# Patient Record
Sex: Female | Born: 1945 | Race: White | Hispanic: No | Marital: Married | Smoking: Never smoker
Health system: Southern US, Community
[De-identification: ages and names within clinical notes are randomized; demographics above are authoritative.]

## PROBLEM LIST (undated history)

## (undated) DIAGNOSIS — F329 Major depressive disorder, single episode, unspecified: Secondary | ICD-10-CM

## (undated) DIAGNOSIS — K219 Gastro-esophageal reflux disease without esophagitis: Secondary | ICD-10-CM

## (undated) DIAGNOSIS — I4891 Unspecified atrial fibrillation: Secondary | ICD-10-CM

## (undated) DIAGNOSIS — I2510 Atherosclerotic heart disease of native coronary artery without angina pectoris: Secondary | ICD-10-CM

## (undated) DIAGNOSIS — F32A Depression, unspecified: Secondary | ICD-10-CM

## (undated) DIAGNOSIS — C801 Malignant (primary) neoplasm, unspecified: Secondary | ICD-10-CM

## (undated) DIAGNOSIS — R32 Unspecified urinary incontinence: Secondary | ICD-10-CM

## (undated) DIAGNOSIS — I5020 Unspecified systolic (congestive) heart failure: Secondary | ICD-10-CM

## (undated) HISTORY — PX: OTHER SURGICAL HISTORY: SHX169

---
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
## 2019-08-06 ENCOUNTER — Inpatient Hospital Stay (HOSPITAL_COMMUNITY)
Admission: AD | Admit: 2019-08-06 | Discharge: 2019-08-10 | DRG: 308 | Disposition: A | Discharge status: Home / Self Care | Payer: Medicare PPO | Source: Other Acute Inpatient Hospital | Attending: Family Medicine | Admitting: Family Medicine

## 2019-08-06 DIAGNOSIS — Z923 Personal history of irradiation: Secondary | ICD-10-CM | POA: Diagnosis not present

## 2019-08-06 DIAGNOSIS — I5020 Unspecified systolic (congestive) heart failure: Secondary | ICD-10-CM | POA: Diagnosis present

## 2019-08-06 DIAGNOSIS — Z7901 Long term (current) use of anticoagulants: Secondary | ICD-10-CM | POA: Diagnosis not present

## 2019-08-06 DIAGNOSIS — Z8589 Personal history of malignant neoplasm of other organs and systems: Secondary | ICD-10-CM | POA: Diagnosis not present

## 2019-08-06 DIAGNOSIS — J159 Unspecified bacterial pneumonia: Secondary | ICD-10-CM | POA: Diagnosis present

## 2019-08-06 DIAGNOSIS — C3432 Malignant neoplasm of lower lobe, left bronchus or lung: Secondary | ICD-10-CM | POA: Diagnosis present

## 2019-08-06 DIAGNOSIS — K219 Gastro-esophageal reflux disease without esophagitis: Secondary | ICD-10-CM | POA: Diagnosis present

## 2019-08-06 DIAGNOSIS — I110 Hypertensive heart disease with heart failure: Secondary | ICD-10-CM | POA: Diagnosis present

## 2019-08-06 DIAGNOSIS — I2510 Atherosclerotic heart disease of native coronary artery without angina pectoris: Secondary | ICD-10-CM | POA: Diagnosis present

## 2019-08-06 DIAGNOSIS — J189 Pneumonia, unspecified organism: Secondary | ICD-10-CM

## 2019-08-06 DIAGNOSIS — K5790 Diverticulosis of intestine, part unspecified, without perforation or abscess without bleeding: Secondary | ICD-10-CM | POA: Diagnosis present

## 2019-08-06 DIAGNOSIS — Z23 Encounter for immunization: Secondary | ICD-10-CM | POA: Diagnosis not present

## 2019-08-06 DIAGNOSIS — Z955 Presence of coronary angioplasty implant and graft: Secondary | ICD-10-CM | POA: Diagnosis not present

## 2019-08-06 DIAGNOSIS — I5023 Acute on chronic systolic (congestive) heart failure: Secondary | ICD-10-CM

## 2019-08-06 DIAGNOSIS — D696 Thrombocytopenia, unspecified: Secondary | ICD-10-CM | POA: Diagnosis present

## 2019-08-06 DIAGNOSIS — Z20828 Contact with and (suspected) exposure to other viral communicable diseases: Secondary | ICD-10-CM | POA: Diagnosis present

## 2019-08-06 DIAGNOSIS — I160 Hypertensive urgency: Secondary | ICD-10-CM | POA: Diagnosis present

## 2019-08-06 DIAGNOSIS — Z66 Do not resuscitate: Secondary | ICD-10-CM | POA: Diagnosis present

## 2019-08-06 DIAGNOSIS — Z800 Family history of malignant neoplasm of digestive organs: Secondary | ICD-10-CM | POA: Diagnosis not present

## 2019-08-06 DIAGNOSIS — R918 Other nonspecific abnormal finding of lung field: Secondary | ICD-10-CM | POA: Diagnosis not present

## 2019-08-06 DIAGNOSIS — R079 Chest pain, unspecified: Secondary | ICD-10-CM

## 2019-08-06 DIAGNOSIS — I4891 Unspecified atrial fibrillation: Principal | ICD-10-CM | POA: Diagnosis present

## 2019-08-06 DIAGNOSIS — Z833 Family history of diabetes mellitus: Secondary | ICD-10-CM | POA: Diagnosis not present

## 2019-08-06 DIAGNOSIS — R296 Repeated falls: Secondary | ICD-10-CM | POA: Diagnosis present

## 2019-08-06 DIAGNOSIS — I340 Nonrheumatic mitral (valve) insufficiency: Secondary | ICD-10-CM | POA: Diagnosis not present

## 2019-08-06 HISTORY — DX: Malignant (primary) neoplasm, unspecified: C80.1

## 2019-08-06 HISTORY — DX: Gastro-esophageal reflux disease without esophagitis: K21.9

## 2019-08-06 HISTORY — DX: Unspecified systolic (congestive) heart failure: I50.20

## 2019-08-06 HISTORY — DX: Unspecified urinary incontinence: R32

## 2019-08-06 HISTORY — DX: Atherosclerotic heart disease of native coronary artery without angina pectoris: I25.10

## 2019-08-06 HISTORY — DX: Unspecified atrial fibrillation: I48.91

## 2019-08-06 HISTORY — DX: Depression, unspecified: F32.A

## 2019-08-06 LAB — CBC
HCT: 34.9 % — ABNORMAL LOW (ref 36.0–46.0)
Hemoglobin: 11.2 g/dL — ABNORMAL LOW (ref 12.0–15.0)
MCH: 30 pg (ref 26.0–34.0)
MCHC: 32.1 g/dL (ref 30.0–36.0)
MCV: 93.6 fL (ref 80.0–100.0)
Platelets: 119 10*3/uL — ABNORMAL LOW (ref 150–400)
RBC: 3.73 MIL/uL — ABNORMAL LOW (ref 3.87–5.11)
RDW: 13.8 % (ref 11.5–15.5)
WBC: 5.8 10*3/uL (ref 4.0–10.5)
nRBC: 0 % (ref 0.0–0.2)

## 2019-08-06 LAB — BASIC METABOLIC PANEL
Anion gap: 7 (ref 5–15)
BUN: 13 mg/dL (ref 8–23)
CO2: 24 mmol/L (ref 22–32)
Calcium: 9 mg/dL (ref 8.9–10.3)
Chloride: 111 mmol/L (ref 98–111)
Creatinine, Ser: 0.72 mg/dL (ref 0.44–1.00)
GFR calc Af Amer: >60 mL/min (ref >60)
GFR calc non Af Amer: >60 mL/min (ref >60)
Glucose, Bld: 91 mg/dL (ref 70–99)
Potassium: 3.9 mmol/L (ref 3.5–5.1)
Sodium: 142 mmol/L (ref 135–145)

## 2019-08-06 LAB — TSH: TSH: 1.948 u[IU]/mL (ref 0.350–4.500)

## 2019-08-06 MED ORDER — SODIUM CHLORIDE 0.9 % IV SOLN
500.0000 mg | INTRAVENOUS | Status: DC
  Administered 2019-08-07 – 2019-08-08 (×3): 500 mg via INTRAVENOUS
  Filled 2019-08-06 (×4): qty 500

## 2019-08-06 MED ORDER — HEPARIN (PORCINE) 25000 UT/250ML-% IV SOLN
1100.0000 [IU]/h | INTRAVENOUS | Status: DC
  Administered 2019-08-07: 950 [IU]/h via INTRAVENOUS
  Filled 2019-08-06: qty 250

## 2019-08-06 MED ORDER — SODIUM CHLORIDE 0.9 % IV SOLN
1.0000 g | INTRAVENOUS | Status: DC
  Administered 2019-08-07 – 2019-08-09 (×4): 1 g via INTRAVENOUS
  Filled 2019-08-06 (×4): qty 1
  Filled 2019-08-06: qty 10

## 2019-08-06 NOTE — H&P (Addendum)
History and Physical    Sheri Booth NUU:[id number] DOB: [birth year] DOA: 08/06/2019  PCP: Eleanora Neighbor, MD  Patient coming from: Home and transfer from [facility]   I have personally briefly reviewed patient's old medical records in [place]  Chief Complaint: Nausea vomiting and fall  HPI: Sheri Booth is a 73 y.o. female with medical history significant of CAD status post stent x2, GERD, incontinence and depression who presents as a transfer [facility] [place] [facility] for higher acuity of care secondary to Sheri onset atrial fibrillation with RVR.  Patient overall a poor historian.  However, she reports that prior to admission she had 1 week of nausea and vomiting after eating some chocolate cereal.  She then reports a fall where she had loss of consciousness and her husband called EMS.  Per report, she was found to have atrial fibrillation with RVR with rates up to 116 with nonspecific T wave changes in V5-V6 on EKG.  Troponin of 0.73 then 0.87.  She was then started on IV heparin and IV diltiazem drip with eventual conversion back to normal sinus. Patient had a CT chest that also noted basilar infiltrate concerning for pneumonia.  Also noted cardiomegaly with minimal pericardial fluid.  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 Patient denies any chest pain, shortness of breath.  No current nausea, vomiting or diarrhea.  No abdominal pain.    Review of Systems:  Constitutional: No Weight Change, No Fever ENT/Mouth: No sore throat, No Rhinorrhea Eyes: No Eye Pain, No Vision Changes Cardiovascular: No Chest Pain, no SOB, No PND, No Dyspnea on Exertion,  Respiratory: No Cough, No Sputum, No Wheezing, Dyspnea  Gastrointestinal: No Nausea, No Vomiting, No Diarrhea, No Constipation, No Pain Genitourinary: no  Urinary Incontinence, No Urgency, No Flank Pain Musculoskeletal: No Arthralgias, No Myalgias Skin: No Skin Lesions, No Pruritus, Neuro: no Weakness, No Numbness,  No Loss of Consciousness, No Syncope Psych: No Anxiety/Panic, No Depression, decrease appetite Heme/Lymph: No Bruising, No Bleeding    Family history reviewed and not pertinent   Prior to Admission medications   Not on File    Physical Exam: Vitals:   08/06/19 2026  BP: (!) 160/76  Pulse: 74  Temp: 97.9 F (36.6 C)  TempSrc: Oral  Weight: 74 kg    Constitutional: NAD, calm, comfortable Vitals:   08/06/19 2026  BP: (!) 160/76  Pulse: 74  Temp: 97.9 F (36.6 C)  TempSrc: Oral  Weight: 74 kg   Eyes: PERRL, lids and conjunctivae normal ENMT: Mucous membranes are moist. Posterior pharynx clear of any exudate or lesions.No dentition.  Neck: normal, supple, no masses, no thyromegaly Respiratory: clear to auscultation bilaterally, no wheezing, no crackles. Normal respiratory effort. No accessory muscle use.  Cardiovascular: Regular rate and rhythm, 3/6 systolic murmur.  No extremity edema. 2+ pedal pulses. No carotid bruits.  Abdomen: no tenderness, no masses palpated. No hepatosplenomegaly. Bowel sounds positive.  Musculoskeletal: no clubbing / cyanosis. No joint deformity upper and lower extremities. Good ROM, no contractures. Normal muscle tone.  Skin: no rashes, lesions, ulcers. No induration Neurologic: CN 2-12 grossly intact. Sensation intact, DTR normal. Strength 5/5 in all 4.  Psychiatric: Normal judgment and insight. Alert and oriented x 3. Normal mood.     Labs on Admission: I have personally reviewed following labs and imaging studies  CBC: No results  for input(s): WBC, NEUTROABS, HGB, HCT, MCV, PLT in the last 168 hours. Basic Metabolic Panel: No results for input(s): NA, K, CL, CO2, GLUCOSE, BUN, CREATININE, CALCIUM, MG, PHOS in the last 168 hours. GFR: CrCl cannot be calculated (No successful lab  value found.). Liver Function Tests: No results for input(s): AST, ALT, ALKPHOS, BILITOT, PROT, ALBUMIN in the last 168 hours. No results for input(s): LIPASE, AMYLASE in the last 168 hours. No results for input(s): AMMONIA in the last 168 hours. Coagulation Profile: No results for input(s): INR, PROTIME in the last 168 hours. Cardiac Enzymes: No results for input(s): CKTOTAL, CKMB, CKMBINDEX, TROPONINI in the last 168 hours. BNP (last 3 results) No results for input(s): PROBNP in the last 8760 hours. HbA1C: No results for input(s): HGBA1C in the last 72 hours. CBG: No results for input(s): GLUCAP in the last 168 hours. Lipid Profile: No results for input(s): CHOL, HDL, LDLCALC, TRIG, CHOLHDL, LDLDIRECT in the last 72 hours. Thyroid Function Tests: No results for input(s): TSH, T4TOTAL, FREET4, T3FREE, THYROIDAB in the last 72 hours. Anemia Panel: No results for input(s): VITAMINB12, FOLATE, FERRITIN, TIBC, IRON, RETICCTPCT in the last 72 hours. Urine analysis: No results found for: COLORURINE, APPEARANCEUR, LABSPEC, PHURINE, GLUCOSEU, HGBUR, BILIRUBINUR, KETONESUR, PROTEINUR, UROBILINOGEN, NITRITE, LEUKOCYTESUR  Radiological Exams on Admission: No results found.  EKG: Independently reviewed.  Assessment/Plan  Sheri onset atrial fibrillation with RVR -Patient was in sinus rhythm after arrival from outside facility.suspect secondary to hypovolemia from vomiting. -Obtain EKG to confirm sinus rhythm -CHADVas of 4- Continue IV heparin.  - stop IV diltiazem.  - TSH normal - obtain echo  Community Acquired pneumonia -Patient found to have bibasilar infiltrate on CT chest from outside facility -Start IV Rocephin and azithromycin   Thrombocytopenia -Stable.  Patient apparently had low platelet count of 130 nave in an outside facility.  Low concerns for heparin-induced thrombosis at this time.    DVT prophylaxis: IV heparin Code Status: Full Family Communication: Plan  discussed with patient Disposition Plan: Home Consults called:  Admission status: Inpatient for 2 midnight stay for treatment of Sheri pneumonia and workup of Sheri A.fib    Megha Agnes T Marleah Beever DO [facility]   If 7PM-7AM, please contact night-coverage [email or web address] Password [facility]  08/06/2019, 9:27 PM

## 2019-08-06 NOTE — Progress Notes (Addendum)
ANTICOAGULATION CONSULT NOTE - Initial Consult  Pharmacy Consult for Heparin  Indication: atrial fibrillation  No Known Allergies  Patient Measurements: Height: 5\' 2"  (157.5 cm)(estimated by RN 08/06/19 PM) Weight: 163 lb 1.6 oz (74 kg) IBW/kg (Calculated) : 50.1 Heparin Dosing Weight: 66 kg  Vital Signs: Temp: 97.9 F (36.6 C) (09/12 2026) Temp Source: Oral (09/12 2026) BP: 160/76 (09/12 2026) Pulse Rate: 74 (09/12 2026)  Labs: Recent Labs    08/06/19 2123  HGB 11.2*  HCT 34.9*  PLT 119*  CREATININE 0.72    Estimated Creatinine Clearance: 58.1 mL/min (by C-G formula based on SCr of 0.72 mg/dL).   Medical History: No past medical history on file.  Assessment: 73 y.o female transferred from [facility] to [facility] currently on IV heparin infusion at 950 units/hr.  Pharmacy consulted to dose IV hepairn for Atrial fibrillation.   At OSH heparin started 9/22 @22 :35 with 4000 units IV bolus, drip at 950 units/hr - no labs available in OSH records  Goal of Therapy:  Heparin level 0.3-0.7 units/ml Monitor platelets by anticoagulation protocol: Yes   Plan:  Continue heparin drip at 950 units/hr  Check HL now Daily heparin level and CBC  Thank you for allowing pharmacy to be part of this patients care team. Nicole Cella, RPh Clinical Pharmacist Please check AMION for all [place] phone numbers After 10:00 PM, call [place] 08/06/2019   Arman Bogus 08/06/2019,10:32 PM

## 2019-08-07 ENCOUNTER — Inpatient Hospital Stay (HOSPITAL_COMMUNITY): Payer: Medicare PPO

## 2019-08-07 DIAGNOSIS — I340 Nonrheumatic mitral (valve) insufficiency: Secondary | ICD-10-CM

## 2019-08-07 DIAGNOSIS — J159 Unspecified bacterial pneumonia: Secondary | ICD-10-CM

## 2019-08-07 DIAGNOSIS — I4891 Unspecified atrial fibrillation: Principal | ICD-10-CM

## 2019-08-07 DIAGNOSIS — D696 Thrombocytopenia, unspecified: Secondary | ICD-10-CM | POA: Diagnosis present

## 2019-08-07 LAB — CBC
HCT: 36.7 % (ref 36.0–46.0)
Hemoglobin: 12.2 g/dL (ref 12.0–15.0)
MCH: 30.4 pg (ref 26.0–34.0)
MCHC: 33.2 g/dL (ref 30.0–36.0)
MCV: 91.5 fL (ref 80.0–100.0)
Platelets: 126 10*3/uL — ABNORMAL LOW (ref 150–400)
RBC: 4.01 MIL/uL (ref 3.87–5.11)
RDW: 13.7 % (ref 11.5–15.5)
WBC: 7 10*3/uL (ref 4.0–10.5)
nRBC: 0 % (ref 0.0–0.2)

## 2019-08-07 LAB — HEPARIN LEVEL (UNFRACTIONATED)
Heparin Unfractionated: 0.33 IU/mL (ref 0.30–0.70)
Heparin Unfractionated: <0.1 IU/mL — ABNORMAL LOW (ref 0.30–0.70)

## 2019-08-07 LAB — ECHOCARDIOGRAM COMPLETE
Height: 62 in
Weight: 2595.2 oz

## 2019-08-07 LAB — PROCALCITONIN: Procalcitonin: 0.13 ng/mL

## 2019-08-07 MED ORDER — DILTIAZEM HCL ER COATED BEADS 120 MG PO CP24
120.0000 mg | ORAL_CAPSULE | Freq: Every day | ORAL | Status: DC
  Administered 2019-08-07 – 2019-08-10 (×4): 120 mg via ORAL
  Filled 2019-08-07 (×4): qty 1

## 2019-08-07 MED ORDER — APIXABAN 5 MG PO TABS
5.0000 mg | ORAL_TABLET | Freq: Two times a day (BID) | ORAL | Status: DC
  Administered 2019-08-07 – 2019-08-10 (×7): 5 mg via ORAL
  Filled 2019-08-07 (×7): qty 1

## 2019-08-07 MED ORDER — HEPARIN BOLUS VIA INFUSION
2000.0000 [IU] | Freq: Once | INTRAVENOUS | Status: AC
  Administered 2019-08-07: 2000 [IU] via INTRAVENOUS
  Filled 2019-08-07: qty 2000

## 2019-08-07 NOTE — Progress Notes (Signed)
[place] for Heparin  Indication: atrial fibrillation  No Known Allergies  Patient Measurements: Height: 5\' 2"  (157.5 cm)(estimated by RN 08/06/19 PM) Weight: 163 lb 1.6 oz (74 kg) IBW/kg (Calculated) : 50.1 Heparin Dosing Weight: 66 kg  Vital Signs: Temp: 97.9 F (36.6 C) (09/12 2026) Temp Source: Oral (09/12 2026) BP: 160/76 (09/12 2026) Pulse Rate: 74 (09/12 2026)  Labs: Recent Labs    08/06/19 2123 08/07/19 0002  HGB 11.2* 12.2  HCT 34.9* 36.7  PLT 119* 126*  HEPARINUNFRC  --  <0.10*  CREATININE 0.72  --     Estimated Creatinine Clearance: 58.1 mL/min (by C-G formula based on SCr of 0.72 mg/dL).  Assessment: 73 y.o. female with Afib for heparin.  Pt lost IV access after transfer and heparin was off for ~ 1 hour  Goal of Therapy:  Heparin level 0.3-0.7 units/ml Monitor platelets by anticoagulation protocol: Yes   Plan:  Heparin 2000 units IV bolus, then increase heparin  1100 units/hr Follow-up am labs.   Caryl Pina 08/07/2019,12:55 AM

## 2019-08-07 NOTE — Evaluation (Signed)
Clinical/Bedside Swallow Evaluation Patient Details  Name: Sheri Booth MRN: [id number] Date of Birth: 1946/05/18  Today's Date: 08/07/2019 Time: SLP Start Time (ACUTE ONLY): 1500 SLP Stop Time (ACUTE ONLY): 1515 SLP Time Calculation (min) (ACUTE ONLY): 15 min  HPI:  Sheri Booth is a 73 y.o. female with medical history significant of CAD status post stent x2, GERD, incontinence and depression who presents with Sheri onset atrial fibrillation with RVR.  Patient had a CT chest that also noted basilar infiltrate concerning for pneumonia.  Also noted cardiomegaly with minimal pericardial fluid.  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   Assessment / Plan / Recommendation Clinical Impression  Pt reports she sometimes has trouble swallowing because she cant chew her food due to missing dentition. SLP offered pt tirlas of all textures, including regular solids which she was able to masticate. Given her wishes to soften her diet, will change order to mechanical soft foods. No SLP f/u needed will sign off.  SLP Visit Diagnosis: Dysphagia, unspecified (R13.10)    Aspiration Risk  Mild aspiration risk    Diet Recommendation Dysphagia 3 (Mech soft);Thin liquid   Liquid Administration via: Cup;Straw Medication Administration: Whole meds with liquid Supervision: Patient able to self feed Compensations: Slow rate;Small sips/bites Postural Changes: Seated upright at 90 degrees    Other  Recommendations Oral Care Recommendations: Oral care BID   Follow up Recommendations None      Frequency and Duration            Prognosis        Swallow Study   General HPI: Sheri Booth is a 72 y.o. female with medical history significant of CAD status post stent x2, GERD, incontinence and depression who presents with Sheri onset atrial fibrillation with RVR.   Patient had a CT chest that also noted basilar infiltrate concerning for pneumonia.  Also noted cardiomegaly with minimal pericardial fluid.  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 Type of Study: Bedside Swallow Evaluation Previous Swallow Assessment: none Diet Prior to this Study: Regular;Thin liquids Temperature Spikes Noted: No Respiratory Status: Room air History of Recent Intubation: No Behavior/Cognition: Alert;Cooperative;Pleasant mood Oral Cavity Assessment: Within Functional Limits Oral Care Completed by SLP: No Oral Cavity - Dentition: Edentulous Vision: Functional for self-feeding Self-Feeding Abilities: Able to feed self Patient Positioning: Upright in bed Baseline Vocal Quality: Normal Volitional Cough: Strong Volitional Swallow: Able to elicit    Oral/Motor/Sensory Function Overall Oral Motor/Sensory Function: Within functional limits   Ice Chips Ice chips: Not tested   Thin Liquid Thin Liquid: Within functional limits    Nectar Thick Nectar Thick Liquid: Not tested   Honey Thick Honey Thick Liquid: Not tested   Puree Puree: Within functional limits   Solid     Solid: Within functional limits     Sheri [place], MA [place] Pager [phone] Office [phone]  Sheri Booth 08/07/2019,3:21 PM

## 2019-08-07 NOTE — Progress Notes (Signed)
[place] for Heparin  Indication: atrial fibrillation  No Known Allergies  Patient Measurements: Height: 5\' 2"  (157.5 cm)(estimated by RN 08/06/19 PM) Weight: 162 lb 3.2 oz (73.6 kg) IBW/kg (Calculated) : 50.1 Heparin Dosing Weight: 66 kg  Vital Signs: Temp: 97.9 F (36.6 C) (09/13 0542) Temp Source: Oral (09/13 0542) BP: 160/82 (09/13 0745) Pulse Rate: 79 (09/13 0542)  Labs: Recent Labs    08/06/19 2123 08/07/19 0002 08/07/19 0756  HGB 11.2* 12.2  --   HCT 34.9* 36.7  --   PLT 119* 126*  --   HEPARINUNFRC  --  <0.10* 0.33  CREATININE 0.72  --   --     Estimated Creatinine Clearance: 58 mL/min (by C-G formula based on SCr of 0.72 mg/dL).  Assessment: 73 y.o. female with Afib for heparin.  Pt lost IV access after transfer and heparin was off for ~ 1 hour. Heparin now running at 1100 units/hr and level this AM is within goal range.   No overt bleeding or complications noted, CBC stable.  Pharmacy now asked to change to Eliquis.  Pt age, weight and Scr qualify for normal afib dosing.  Goal of Therapy:  Heparin level 0.3-0.7 units/ml Monitor platelets by anticoagulation protocol: Yes   Plan:  D/c heparin Start Eliquis 5 mg BID. Will educate patient/family on Eliquis prior to d/c.  Marguerite Olea, [place] Clinical Pharmacist Phone [phone]  08/07/2019 11:29 AM

## 2019-08-07 NOTE — Progress Notes (Signed)
PROGRESS NOTE    [name]  HWE:[id number] DOB: 12/15/1945 DOA: 08/06/2019 PCP: Eleanora Neighbor, MD  Outpatient Specialists:   Brief Narrative:  Sheri Booth is a 73 y.o. female with medical history significant of CAD status post stent x2, GERD, incontinence and depression who presents as a transfer [facility] [place] [facility] for higher acuity of care secondary to new onset atrial fibrillation with RVR.  Per report, she was found to have atrial fibrillation with RVR with rates up to 116 with nonspecific T wave changes in V5-V6 on EKG.  Troponin of 0.73 then 0.87.  She was then started on IV heparin and IV diltiazem drip with eventual conversion back to normal sinus.  Patient had a CT chest that also noted basilar infiltrate concerning for pneumonia.  Also noted cardiomegaly with minimal pericardial fluid.  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  08/07/2019: Patient seen.  Patient is a fairly poor historian.  Not certain if patient has an underlying cognitive deficit.  Patient reported recurrent falls, but could not tell me why she was transferred to this hospital.  Patient is currently in normal sinus rhythm, rate controlled.  Patient is only on IV heparin drip, as well as, antibiotics for possible pneumonia.  No documented fever.  No leukocytosis.  Will check procalcitonin and repeat chest x-ray.  Discussed cardiology consultation with the cardiology team.   Assessment & Plan:   Active Problems:   Atrial fibrillation with RVR (HCC)   Thrombocytopenia (HCC)   Community acquired bacterial pneumonia  New onset atrial fibrillation with RVR -Patient was in sinus rhythm after arrival from outside facility.suspect secondary to hypovolemia from vomiting. -Obtain EKG to confirm sinus rhythm -CHADVas of 4- Continue IV heparin.  - stop IV  diltiazem.  - TSH normal - obtain echo 08/07/2019: See above documentation.  Patient is currently in normal sinus rhythm.  Intravenous Cardizem is on hold.  Patient is still on heparin drip.  Will await cardiology input.  Possible community Acquired pneumonia -Patient found to have bibasilar infiltrate on CT chest from outside facility -No documented fever leukocytosis. -We will check procalcitonin. -Will repeat chest x-ray.  Thrombocytopenia -Stable.  Patient apparently had low platelet count of 130 nave in an outside facility.  Low concerns for heparin-induced thrombosis at this time.  Recurrent falls (as per patient): -Consult physical therapy. -Further management depend on hospital course.  Possible cognitive deficit: -Patient remains a very poor historian. -Collateral information will be needed.  Further management depend on hospital course.    DVT prophylaxis: IV heparin Code Status: Full Family Communication:  Disposition Plan: Home Consults called:  Disposition Plan: This will depend on hospital course.  Await physical therapy input.  Procedures:   Echo done 08/07/2019.  The result is pending.  Antimicrobials:   IV azithromycin.  IV ceftriaxone   Subjective: Poor historian. No new complaints.  Objective: Vitals:   08/06/19 2026 08/06/19 2100 08/07/19 0542 08/07/19 0745  BP: (!) 160/76  (!) 116/97 (!) 160/82  Pulse: 74  79   Resp:   16   Temp: 97.9 F (36.6 C)  97.9 F (36.6 C)   TempSrc: Oral  Oral   SpO2:   98%   Weight: 74 kg  73.6 kg   Height:  5\' 2"  (1.575 m)      Intake/Output Summary (Last 24 hours) at 08/07/2019 1038 Last  data filed at 08/06/2019 2039 Gross per 24 hour  Intake -  Output 300 ml  Net -300 ml   Filed Weights   08/06/19 2026 08/07/19 0542  Weight: 74 kg 73.6 kg    Examination:  General exam: Appears calm and comfortable  Respiratory system: Clear to auscultation. Cardiovascular system: S1 & S2 heard.  Gastrointestinal system: Abdomen is nondistended, soft and nontender. No organomegaly or masses felt. Normal bowel sounds heard. Central nervous system: Alert and oriented. No focal neurological deficits. Extremities: No leg edema. Psychiatry: Awake and alert.  Patient was all extremities.  Patient knew the year and month.    Data Reviewed: I have personally reviewed following labs and imaging studies  CBC: Recent Labs  Lab 08/06/19 2123 08/07/19 0002  WBC 5.8 7.0  HGB 11.2* 12.2  HCT 34.9* 36.7  MCV 93.6 91.5  PLT 119* 211*   Basic Metabolic Panel: Recent Labs  Lab 08/06/19 2123  NA 142  K 3.9  CL 111  CO2 24  GLUCOSE 91  BUN 13  CREATININE 0.72  CALCIUM 9.0   GFR: Estimated Creatinine Clearance: 58 mL/min (by C-G formula based on SCr of 0.72 mg/dL). Liver Function Tests: No results for input(s): AST, ALT, ALKPHOS, BILITOT, PROT, ALBUMIN in the last 168 hours. No results for input(s): LIPASE, AMYLASE in the last 168 hours. No results for input(s): AMMONIA in the last 168 hours. Coagulation Profile: No results for input(s): INR, PROTIME in the last 168 hours. Cardiac Enzymes: No results for input(s): CKTOTAL, CKMB, CKMBINDEX, TROPONINI in the last 168 hours. BNP (last 3 results) No results for input(s): PROBNP in the last 8760 hours. HbA1C: No results for input(s): HGBA1C in the last 72 hours. CBG: No results for input(s): GLUCAP in the last 168 hours. Lipid Profile: No results for input(s): CHOL, HDL, LDLCALC, TRIG, CHOLHDL, LDLDIRECT in the last 72 hours. Thyroid Function Tests: Recent Labs    08/06/19 2123  TSH 1.948   Anemia Panel: No results for input(s): VITAMINB12, FOLATE, FERRITIN, TIBC, IRON, RETICCTPCT in the last 72 hours. Urine analysis: No results found for: COLORURINE, APPEARANCEUR, LABSPEC, PHURINE, GLUCOSEU, HGBUR, BILIRUBINUR, KETONESUR, PROTEINUR, UROBILINOGEN, NITRITE, LEUKOCYTESUR Sepsis Labs: @LABRCNTIP (procalcitonin:4,lacticidven:4)   )No results found for this or any previous visit (from the past 240 hour(s)).       Radiology Studies: No results found.      Scheduled Meds: Continuous Infusions: . azithromycin 500 mg (08/07/19 0046)  . cefTRIAXone (ROCEPHIN)  IV 1 g (08/07/19 0102)  . heparin 1,100 Units/hr (08/07/19 0104)     LOS: 1 day    Time spent: 35 minutes.  Dana Allan, MD  [facility] Pager #: [phone] 7PM-7AM contact night coverage as above

## 2019-08-07 NOTE — Progress Notes (Signed)
[place] for Heparin  Indication: atrial fibrillation  No Known Allergies  Patient Measurements: Height: 5\' 2"  (157.5 cm)(estimated by RN 08/06/19 PM) Weight: 162 lb 3.2 oz (73.6 kg) IBW/kg (Calculated) : 50.1 Heparin Dosing Weight: 66 kg  Vital Signs: Temp: 97.9 F (36.6 C) (09/13 0542) Temp Source: Oral (09/13 0542) BP: 160/82 (09/13 0745) Pulse Rate: 79 (09/13 0542)  Labs: Recent Labs    08/06/19 2123 08/07/19 0002 08/07/19 0756  HGB 11.2* 12.2  --   HCT 34.9* 36.7  --   PLT 119* 126*  --   HEPARINUNFRC  --  <0.10* 0.33  CREATININE 0.72  --   --     Estimated Creatinine Clearance: 58 mL/min (by C-G formula based on SCr of 0.72 mg/dL).  Assessment: 73 y.o. female with Afib for heparin.  Pt lost IV access after transfer and heparin was off for ~ 1 hour. Heparin now running at 1100 units/hr and level this AM is within goal range.   No overt bleeding or complications noted, CBC stable.  Goal of Therapy:  Heparin level 0.3-0.7 units/ml Monitor platelets by anticoagulation protocol: Yes   Plan:  Continue IV heparin at current rate. Confirm heparin level in 8 hrs Daily heparin level and CBC.  Marguerite Olea, [facility] Clinical Pharmacist Phone [phone]  08/07/2019 9:02 AM

## 2019-08-07 NOTE — Progress Notes (Signed)
  Echocardiogram 2D Echocardiogram has been performed.  Sheri Booth L Androw 08/07/2019, 10:26 AM

## 2019-08-07 NOTE — Progress Notes (Signed)
Notified Dr. Marthenia Rolling on rounding that home meds needed to be addressed.

## 2019-08-08 DIAGNOSIS — I160 Hypertensive urgency: Secondary | ICD-10-CM

## 2019-08-08 LAB — CBC
HCT: 36.9 % (ref 36.0–46.0)
Hemoglobin: 12.1 g/dL (ref 12.0–15.0)
MCH: 30.3 pg (ref 26.0–34.0)
MCHC: 32.8 g/dL (ref 30.0–36.0)
MCV: 92.5 fL (ref 80.0–100.0)
Platelets: 123 10*3/uL — ABNORMAL LOW (ref 150–400)
RBC: 3.99 MIL/uL (ref 3.87–5.11)
RDW: 13.6 % (ref 11.5–15.5)
WBC: 5.1 10*3/uL (ref 4.0–10.5)
nRBC: 0 % (ref 0.0–0.2)

## 2019-08-08 LAB — BRAIN NATRIURETIC PEPTIDE: B Natriuretic Peptide: 134.9 pg/mL — ABNORMAL HIGH (ref 0.0–100.0)

## 2019-08-08 MED ORDER — IRBESARTAN 150 MG PO TABS: 150.0000 mg | ORAL_TABLET | Freq: Every day | ORAL | Status: DC

## 2019-08-08 MED ORDER — ALBUTEROL SULFATE (2.5 MG/3ML) 0.083% IN NEBU: 3.0000 mL | INHALATION_SOLUTION | Freq: Four times a day (QID) | RESPIRATORY_TRACT | Status: DC | PRN

## 2019-08-08 MED ORDER — DULOXETINE HCL 60 MG PO CPEP
60.0000 mg | ORAL_CAPSULE | Freq: Every day | ORAL | Status: DC
  Administered 2019-08-08 – 2019-08-10 (×3): 60 mg via ORAL
  Filled 2019-08-08 (×3): qty 1

## 2019-08-08 MED ORDER — LISINOPRIL 40 MG PO TABS
40.0000 mg | ORAL_TABLET | Freq: Every day | ORAL | Status: DC
  Administered 2019-08-08 – 2019-08-10 (×3): 40 mg via ORAL
  Filled 2019-08-08 (×3): qty 1

## 2019-08-08 MED ORDER — FAMOTIDINE 20 MG PO TABS
20.0000 mg | ORAL_TABLET | Freq: Two times a day (BID) | ORAL | Status: DC
  Administered 2019-08-08 – 2019-08-10 (×5): 20 mg via ORAL
  Filled 2019-08-08 (×5): qty 1

## 2019-08-08 MED ORDER — FLUTICASONE PROPIONATE 50 MCG/ACT NA SUSP
2.0000 | Freq: Every day | NASAL | Status: DC
  Administered 2019-08-08 – 2019-08-09 (×2): 2 via NASAL
  Filled 2019-08-08: qty 16

## 2019-08-08 MED ORDER — CARVEDILOL 3.125 MG PO TABS
3.1250 mg | ORAL_TABLET | Freq: Two times a day (BID) | ORAL | Status: DC
  Administered 2019-08-08 – 2019-08-10 (×5): 3.125 mg via ORAL
  Filled 2019-08-08 (×5): qty 1

## 2019-08-08 MED ORDER — LORATADINE 10 MG PO TABS
10.0000 mg | ORAL_TABLET | Freq: Every day | ORAL | Status: DC
  Administered 2019-08-08 – 2019-08-10 (×3): 10 mg via ORAL
  Filled 2019-08-08 (×3): qty 1

## 2019-08-08 MED ORDER — FUROSEMIDE 10 MG/ML IJ SOLN
40.0000 mg | Freq: Once | INTRAMUSCULAR | Status: AC
  Administered 2019-08-08: 12:00:00 40 mg via INTRAVENOUS
  Filled 2019-08-08: qty 4

## 2019-08-08 MED ORDER — ROSUVASTATIN CALCIUM 20 MG PO TABS
20.0000 mg | ORAL_TABLET | Freq: Every day | ORAL | Status: DC
  Administered 2019-08-08 – 2019-08-10 (×3): 20 mg via ORAL
  Filled 2019-08-08 (×4): qty 1

## 2019-08-08 MED ORDER — PANTOPRAZOLE SODIUM 40 MG PO TBEC
40.0000 mg | DELAYED_RELEASE_TABLET | Freq: Every day | ORAL | Status: DC
  Administered 2019-08-08 – 2019-08-10 (×3): 40 mg via ORAL
  Filled 2019-08-08 (×3): qty 1

## 2019-08-08 MED ORDER — OXYBUTYNIN CHLORIDE 5 MG PO TABS
5.0000 mg | ORAL_TABLET | Freq: Every day | ORAL | Status: DC
  Administered 2019-08-08 – 2019-08-10 (×3): 5 mg via ORAL
  Filled 2019-08-08 (×3): qty 1

## 2019-08-08 MED ORDER — OXYCODONE-ACETAMINOPHEN 5-325 MG PO TABS
1.0000 | ORAL_TABLET | Freq: Four times a day (QID) | ORAL | Status: DC | PRN
  Administered 2019-08-08: 1 via ORAL
  Filled 2019-08-08: qty 1

## 2019-08-08 NOTE — Evaluation (Signed)
Physical Therapy Evaluation Patient Details Name: Sheri Booth MRN: [id number] DOB: 28-Jan-1946 Today's Date: 08/08/2019   History of Present Illness  73 y.o. female with medical history significant of CAD status post stent x2, GERD, incontinence and depression who presents as a transfer [facility] for higher acuity of care secondary to new onset atrial fibrillation with RVR.  Per report, she was found to have atrial fibrillation with RVR with rates up to 116 with nonspecific T wave changes in V5-V6 on EKG.  Troponin of 0.73 then 0.87.  She was then started on IV heparin and IV diltiazem drip with eventual conversion back to normal sinus.  Patient had a CT chest that also noted basilar infiltrate concerning for pneumonia.  Clinical Impression  PTA pt living with husband in apartment with level entry. Pt initially reported ambulation with cane, however as session progressed pt stated she was using a wheelchair to get around because she was falling so much lately. Pt reported she even had fallen out of the wheelchair. Pt reports feeling much better today with no dizziness with transfers and ambulation. Pt supervision for bed mobility, min A for transfers and hands-on min guard for ambulation of 240 feet with RW. If pt's dizziness is resolved, PT recommending HHPT level rehab at discharge. Pt would benefit from her husband continuing to stay with niece to allow pt to focus on her own rehab as she is currently her husband's caregiver. PT will continue to follow acutely.     Follow Up Recommendations Home health PT;Supervision - Intermittent    Equipment Recommendations  Rolling walker with 5" wheels       Precautions / Restrictions Precautions Precautions: Fall Precaution Comments: hx of falling Restrictions Weight Bearing Restrictions: No      Mobility  Bed Mobility Overal bed mobility: Needs Assistance Bed Mobility: Supine to Sit     Supine to sit:  Supervision;HOB elevated     General bed mobility comments: supervision for safety, HoB elevated, use of bedrail and increased time and effort  Transfers Overall transfer level: Needs assistance Equipment used: Rolling walker (2 wheeled) Transfers: Sit to/from Stand Sit to Stand: Min assist         General transfer comment: minA and vc for bringing hips to EoB before power up  Ambulation/Gait Ambulation/Gait assistance: Min guard Gait Distance (Feet): 240 Feet Assistive device: Rolling walker (2 wheeled) Gait Pattern/deviations: Step-through pattern;Decreased step length - right;Decreased step length - left;Shuffle Gait velocity: slowed Gait velocity interpretation: <1.31 ft/sec, indicative of household ambulator General Gait Details: Hands on min guard for slow, shuffling gait, mild instability but no overt LoB        Balance Overall balance assessment: Needs assistance Sitting-balance support: No upper extremity supported;Feet supported Sitting balance-Leahy Scale: Fair     Standing balance support: Bilateral upper extremity supported Standing balance-Leahy Scale: Poor Standing balance comment: requires UE support for balance                             Pertinent Vitals/Pain Pain Assessment: Faces Faces Pain Scale: Hurts a little bit Pain Location: low  back  Pain Descriptors / Indicators: Aching;Sore Pain Intervention(s): Limited activity within patient's tolerance;Monitored during session;Repositioned    Home Living Family/patient expects to be discharged to:: Private residence Living Arrangements: Spouse/significant other Available Help at Discharge: Family;Available PRN/intermittently Type of Home: Apartment Home Access: Level entry     Home Layout: One level Home  Equipment: Cane - single point;Wheelchair - manual Additional Comments: pt's husband has DM and apparantly multiple toe amputation, pt is primary caregiver    Prior Function Level  of Independence: Independent with assistive device(s)         Comments: pt reports that due to multiple falls she uses wc for mobility and does all of her cooking and cleaning from there, reports only one fall out of w/c, reports independence in bathing and dressing         Extremity/Trunk Assessment        Lower Extremity Assessment RLE Deficits / Details: R hip in external rotation and requires effort to bring to neutral, knees lacking approximately 5 degrees, ankle WFL, strength overall 4/5 RLE Sensation: WNL(however reports buring in feet poss neuropathy) RLE Coordination: decreased fine motor LLE Deficits / Details: hip and ankle ROM WFL, knee lacks 5 degree flexion  LLE Sensation: WNL(however reports buring in feet poss neuropathy) LLE Coordination: decreased fine motor       Communication   Communication: HOH  Cognition Arousal/Alertness: Awake/alert Behavior During Therapy: WFL for tasks assessed/performed Overall Cognitive Status: Impaired/Different from baseline Area of Impairment: Problem solving;Following commands                       Following Commands: Follows one step commands with increased time;Follows multi-step commands with increased time;Follows multi-step commands consistently     Problem Solving: Slow processing;Requires verbal cues;Requires tactile cues General Comments: Pt requires increased time for answering questions and responding to commands      General Comments General comments (skin integrity, edema, etc.): VSS        Assessment/Plan    PT Assessment Patient needs continued PT services  PT Problem List Decreased strength;Decreased activity tolerance;Decreased balance;Decreased mobility;Decreased cognition;Decreased safety awareness;Decreased knowledge of use of DME       PT Treatment Interventions DME instruction;Gait training;Functional mobility training;Therapeutic activities;Therapeutic exercise;Balance  training;Cognitive remediation;Patient/family education    PT Goals (Current goals can be found in the Care Plan section)  Acute Rehab PT Goals Patient Stated Goal: stop falling PT Goal Formulation: With patient Time For Goal Achievement: 08/22/19 Potential to Achieve Goals: Fair    Frequency Min 3X/week    AM-PAC PT "6 Clicks" Mobility  Outcome Measure Help needed turning from your back to your side while in a flat bed without using bedrails?: None Help needed moving from lying on your back to sitting on the side of a flat bed without using bedrails?: None Help needed moving to and from a bed to a chair (including a wheelchair)?: A Little Help needed standing up from a chair using your arms (e.g., wheelchair or bedside chair)?: A Little Help needed to walk in hospital room?: A Little Help needed climbing 3-5 steps with a railing? : A Lot 6 Click Score: 19    End of Session Equipment Utilized During Treatment: Gait belt Activity Tolerance: Patient tolerated treatment well Patient left: in chair;with call bell/phone within reach;with chair alarm set Nurse Communication: Mobility status PT Visit Diagnosis: Unsteadiness on feet (R26.81);Other abnormalities of gait and mobility (R26.89);Muscle weakness (generalized) (M62.81);History of falling (Z91.81);Repeated falls (R29.6);Difficulty in walking, not elsewhere classified (R26.2);Dizziness and giddiness (R42)    Time: 2878-6767 PT Time Calculation (min) (ACUTE ONLY): 26 min   Charges:   PT Evaluation $PT Eval Moderate Complexity: 1 Mod PT Treatments $Gait Training: 8-22 mins        Tishana Clinkenbeard B. Migdalia Dk PT, DPT Acute Rehabilitation Services  Pager [phone] Office [phone]   [place] 08/08/2019, 1:23 PM

## 2019-08-08 NOTE — TOC Benefit Eligibility Note (Signed)
Transition of Care [facility]) Benefit Eligibility Note    Patient Details  Name: Sheri Booth MRN: [id number] Date of Birth: Oct 27, 1946   Medication/Dose: ELIQUIS  5 MG BID  Covered?: Yes  Tier: 3 Drug  Prescription Coverage Preferred Pharmacy: [facility]  Spoke with Person/Company/Phone Number:: WANDA  @ [employer] TY # [phone]  Co-Pay: $8.95  Prior Approval: No  Deductible: (LOWE INCOME SUBSIDY)       Memory Argue Phone Number: 08/08/2019, 3:20 PM

## 2019-08-08 NOTE — Progress Notes (Signed)
PROGRESS NOTE    [name]  PYP:[id number] DOB: 28-Feb-1946 DOA: 08/06/2019 PCP: Eleanora Neighbor, MD  Outpatient Specialists:   Brief Narrative:  Sheri Booth is a 73 y.o. female with medical history significant of CAD status post stent x2, GERD, incontinence and depression who presents as a transfer [facility] [place] [facility] for higher acuity of care secondary to new onset atrial fibrillation with RVR.  Per report, she was found to have atrial fibrillation with RVR with rates up to 116 with nonspecific T wave changes in V5-V6 on EKG.  Troponin of 0.73 then 0.87.  She was then started on IV heparin and IV diltiazem drip with eventual conversion back to normal sinus.  Patient had a CT chest that also noted basilar infiltrate concerning for pneumonia.  Also noted cardiomegaly with minimal pericardial fluid.  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  08/07/2019: Patient seen.  Patient is a fairly poor historian.  Not certain if patient has an underlying cognitive deficit.  Patient reported recurrent falls, but could not tell me why she was transferred to this hospital.  Patient is currently in normal sinus rhythm, rate controlled.  Patient is only on IV heparin drip, as well as, antibiotics for possible pneumonia.  No documented fever.  No leukocytosis.  Will check procalcitonin and repeat chest x-ray.  Discussed cardiology consultation with the cardiology team.  08/08/2019: Patient remains in sinus rhythm.  Blood pressure is uncontrolled.  Will add Coreg 3.125 Mg p.o. twice daily.  Will monitor patient in the hospital while on combination of Cardizem and Coreg.  Will change her to ACE inhibitor.  Will start lisinopril 40 Mg p.o. once daily.  Blood pressure closely.  Echocardiogram revealed EF of 40 to 45% with pseudonormalization of left ventricular  diastolic Doppler parameters.  Left ventricular diffuse hypokinesis was also reported.  Will check BNP.  Will administer 1 dose of IV Lasix 40 Mg.  Further management depend on hospital course.  Will await PT OT input.  Possible discharge tomorrow.   Assessment & Plan:   Active Problems:   Atrial fibrillation with RVR (HCC)   Thrombocytopenia (HCC)   Community acquired bacterial pneumonia  New onset atrial fibrillation with RVR -Patient was in sinus rhythm after arrival from outside facility.suspect secondary to hypovolemia from vomiting. -Obtain EKG to confirm sinus rhythm -CHADVas of 4- Continue IV heparin.  - stop IV diltiazem.  - TSH normal - obtain echo 08/07/2019: See above documentation.  Patient is currently in normal sinus rhythm.  Intravenous Cardizem is on hold.  Patient is still on heparin drip.  Will await cardiology input. 08/08/2019: Discussed case with cardiology team, but no formal cardiology consultation.  Patient is currently on Cardizem XL.  Patient has remained in normal sinus rhythm.  Due to significantly elevated blood pressure and low EF, will add Coreg.  Will monitor heart rate closely while on both Cardizem and Coreg.  Echo findings as documented above.  Patient will need follow-up with cardiology team on discharge.  Patient is not on Eliquis.  Possible community Acquired pneumonia -Patient found to have bibasilar infiltrate on CT chest from outside facility -No documented fever leukocytosis. -We will check procalcitonin. -Will repeat chest x-ray. 08/08/2019: Doubt pneumonia.  Repeat chest x-ray noted.  Will repeat chest x-ray tomorrow after receiving diuretics.  Chest x-ray finding may be related to mild congestive heart  failure.  Thrombocytopenia -Stable.  Patient apparently had low platelet count of 130 nave in an outside facility.  Low concerns for heparin-induced thrombosis at this time.  Recurrent falls (as per patient): -Consult physical therapy.  -Further management depend on hospital course.  Possible cognitive deficit: -Patient remains a very poor historian. -Collateral information will be needed.  Hypertensive urgency: Start Coreg 3.12 Mg p.o. daily Start lisinopril 40 Mg p.o. once daily Continue Cardizem XL 120 mg p.o. once daily Monitor blood pressure closely  Low EF with hypokinesis: Cardizem ordered. Started on lisinopril. Follow-up with cardiology on discharge  Further management depend on hospital course.    DVT prophylaxis: IV heparin Code Status: Full Family Communication:  Disposition Plan: Home Consults called:  Disposition Plan: This will depend on hospital course.  Await physical therapy input.  Procedures:   Echo done 08/07/2019.  The result is pending.  Antimicrobials:   IV azithromycin.  IV ceftriaxone   Subjective: Poor historian. No new complaints.  Objective: Vitals:   08/08/19 0950 08/08/19 1153 08/08/19 1421 08/08/19 1650  BP: (!) 182/93 (!) 147/56 126/75 (!) 145/72  Pulse:   73 70  Resp:      Temp:      TempSrc:      SpO2:   99%   Weight:      Height:        Intake/Output Summary (Last 24 hours) at 08/08/2019 1732 Last data filed at 08/08/2019 1652 Gross per 24 hour  Intake 462 ml  Output 950 ml  Net -488 ml   Filed Weights   08/06/19 2026 08/07/19 0542 08/08/19 0600  Weight: 74 kg 73.6 kg 72.8 kg    Examination:  General exam: Appears calm and comfortable  Respiratory system: Clear to auscultation. Cardiovascular system: S1 & S2 heard. Gastrointestinal system: Abdomen is nondistended, soft and nontender. No organomegaly or masses felt. Normal bowel sounds heard. Central nervous system: Alert and oriented. No focal neurological deficits. Extremities: No leg edema. Psychiatry: Awake and alert.  Patient was all extremities.  Patient knew the year and month.    Data Reviewed: I have personally reviewed following labs and imaging studies  CBC: Recent Labs   Lab 08/06/19 2123 08/07/19 0002 08/08/19 0635  WBC 5.8 7.0 5.1  HGB 11.2* 12.2 12.1  HCT 34.9* 36.7 36.9  MCV 93.6 91.5 92.5  PLT 119* 126* 536*   Basic Metabolic Panel: Recent Labs  Lab 08/06/19 2123  NA 142  K 3.9  CL 111  CO2 24  GLUCOSE 91  BUN 13  CREATININE 0.72  CALCIUM 9.0   GFR: Estimated Creatinine Clearance: 57.7 mL/min (by C-G formula based on SCr of 0.72 mg/dL). Liver Function Tests: No results for input(s): AST, ALT, ALKPHOS, BILITOT, PROT, ALBUMIN in the last 168 hours. No results for input(s): LIPASE, AMYLASE in the last 168 hours. No results for input(s): AMMONIA in the last 168 hours. Coagulation Profile: No results for input(s): INR, PROTIME in the last 168 hours. Cardiac Enzymes: No results for input(s): CKTOTAL, CKMB, CKMBINDEX, TROPONINI in the last 168 hours. BNP (last 3 results) No results for input(s): PROBNP in the last 8760 hours. HbA1C: No results for input(s): HGBA1C in the last 72 hours. CBG: No results for input(s): GLUCAP in the last 168 hours. Lipid Profile: No results for input(s): CHOL, HDL, LDLCALC, TRIG, CHOLHDL, LDLDIRECT in the last 72 hours. Thyroid Function Tests: Recent Labs    08/06/19 2123  TSH 1.948   Anemia Panel: No results for  input(s): VITAMINB12, FOLATE, FERRITIN, TIBC, IRON, RETICCTPCT in the last 72 hours. Urine analysis: No results found for: COLORURINE, APPEARANCEUR, LABSPEC, PHURINE, GLUCOSEU, HGBUR, BILIRUBINUR, KETONESUR, PROTEINUR, UROBILINOGEN, NITRITE, LEUKOCYTESUR Sepsis Labs: @LABRCNTIP (procalcitonin:4,lacticidven:4)  )No results found for this or any previous visit (from the past 240 hour(s)).       Radiology Studies: Dg Chest Port 1 View  Result Date: 08/07/2019 CLINICAL DATA:  Pneumonia history of atrial fibrillation EXAM: PORTABLE CHEST 1 VIEW COMPARISON:  None. FINDINGS: Borderline cardiomegaly. Slightly low lung volume. Perihilar interstitial opacity with mild nodularity. Irregular left  infrahilar opacity and slight asymmetric density in the left hilus. Aortic atherosclerosis. No pneumothorax. IMPRESSION: 1. Increased perihilar interstitial opacity with slight nodularity, possible atypical infection. 2. Irregular focus of airspace disease in the left infrahilar lung, with slightly masslike features, recommend contrast-enhanced chest CT for further evaluation. Slight asymmetric density in the left hilus could also be evaluated at that time. Electronically Signed   By: Donavan Foil M.D.   On: 08/07/2019 15:23        Scheduled Meds: . apixaban  5 mg Oral BID  . carvedilol  3.125 mg Oral BID WC  . diltiazem  120 mg Oral Daily  . DULoxetine  60 mg Oral Daily  . famotidine  20 mg Oral BID  . fluticasone  2 spray Each Nare Daily  . lisinopril  40 mg Oral Daily  . loratadine  10 mg Oral Daily  . oxybutynin  5 mg Oral Daily  . pantoprazole  40 mg Oral Daily  . rosuvastatin  20 mg Oral Daily   Continuous Infusions: . azithromycin 500 mg (08/07/19 2140)  . cefTRIAXone (ROCEPHIN)  IV 1 g (08/07/19 2140)     LOS: 2 days    Time spent: 35 minutes.  Dana Allan, MD  [facility] Pager #: [phone] 7PM-7AM contact night coverage as above

## 2019-08-09 ENCOUNTER — Inpatient Hospital Stay (HOSPITAL_COMMUNITY): Payer: Medicare PPO

## 2019-08-09 LAB — RENAL FUNCTION PANEL
Albumin: 3.3 g/dL — ABNORMAL LOW (ref 3.5–5.0)
Anion gap: 10 (ref 5–15)
BUN: 17 mg/dL (ref 8–23)
CO2: 21 mmol/L — ABNORMAL LOW (ref 22–32)
Calcium: 9.3 mg/dL (ref 8.9–10.3)
Chloride: 105 mmol/L (ref 98–111)
Creatinine, Ser: 0.72 mg/dL (ref 0.44–1.00)
GFR calc Af Amer: >60 mL/min (ref >60)
GFR calc non Af Amer: >60 mL/min (ref >60)
Glucose, Bld: 111 mg/dL — ABNORMAL HIGH (ref 70–99)
Phosphorus: 4.2 mg/dL (ref 2.5–4.6)
Potassium: 3.7 mmol/L (ref 3.5–5.1)
Sodium: 136 mmol/L (ref 135–145)

## 2019-08-09 LAB — CBC
HCT: 33.9 % — ABNORMAL LOW (ref 36.0–46.0)
Hemoglobin: 11.5 g/dL — ABNORMAL LOW (ref 12.0–15.0)
MCH: 30.7 pg (ref 26.0–34.0)
MCHC: 33.9 g/dL (ref 30.0–36.0)
MCV: 90.4 fL (ref 80.0–100.0)
Platelets: 126 10*3/uL — ABNORMAL LOW (ref 150–400)
RBC: 3.75 MIL/uL — ABNORMAL LOW (ref 3.87–5.11)
RDW: 13.7 % (ref 11.5–15.5)
WBC: 5 10*3/uL (ref 4.0–10.5)
nRBC: 0 % (ref 0.0–0.2)

## 2019-08-09 LAB — MAGNESIUM: Magnesium: 1.7 mg/dL (ref 1.7–2.4)

## 2019-08-09 MED ORDER — AZITHROMYCIN 250 MG PO TABS
500.0000 mg | ORAL_TABLET | Freq: Every day | ORAL | Status: DC
  Administered 2019-08-09: 500 mg via ORAL
  Filled 2019-08-09: qty 2

## 2019-08-09 MED ORDER — IOHEXOL 300 MG/ML  SOLN
75.0000 mL | Freq: Once | INTRAMUSCULAR | Status: AC | PRN
  Administered 2019-08-09: 100 mL via INTRAVENOUS

## 2019-08-09 NOTE — Progress Notes (Signed)
Physical Therapy Treatment Patient Details Name: Sheri Booth MRN: [id number] DOB: 11-30-1945 Today's Date: 08/09/2019    History of Present Illness 73 y.o. female with medical history significant of CAD status post stent x2, GERD, incontinence and depression who presents [facility] a transfer [facility] [place] [facility] for higher acuity of care secondary to new onset atrial fibrillation with RVR.  Per report, she was found to have atrial fibrillation with RVR with rates up to 116 with nonspecific T wave changes in V5-V6 on EKG.  Troponin of 0.73 then 0.87.  She was then started on IV heparin and IV diltiazem drip with eventual conversion back to normal sinus.  Patient had a CT chest that also noted basilar infiltrate concerning for pneumonia.    PT Comments    Pt asleep on entry, easily roused and requests to get up to bathroom. Pt is limited in safe mobility by mild instability and decreased endurance. Pt is supervision for bed mobility, min guard for transfers from bed and toilet, and hands on min guard for ambulation of 350 feet without AD. Pt with mild instability with ambulation but no over LoB. Pt with no c/o of dizziness or lightheadedness. D/c plans remain appropriate at this time. PT will continue to follow acutely.    Follow Up Recommendations  Home health PT;Supervision - Intermittent     Equipment Recommendations  Rolling walker with 5" wheels       Precautions / Restrictions Precautions Precautions: Fall Precaution Comments: hx of falling Restrictions Weight Bearing Restrictions: No    Mobility  Bed Mobility Overal bed mobility: Needs Assistance Bed Mobility: Supine to Sit     Supine to sit: Supervision;HOB elevated     General bed mobility comments: supervision for safety, HoB elevated, use of bedrail and increased time and effort  Transfers Overall transfer level: Needs assistance Equipment used: Rolling walker (2 wheeled) Transfers: Sit to/from  Stand Sit to Stand: Min guard         General transfer comment: min guard for safety, vc for slowing down when she stands up and not to walk away from bed until she is sure she is not dizzy   Ambulation/Gait Ambulation/Gait assistance: Min guard Gait Distance (Feet): 350 Feet Assistive device: None Gait Pattern/deviations: Step-through pattern;Decreased step length - right;Decreased step length - left;Shuffle Gait velocity: slowed Gait velocity interpretation: <1.31 ft/sec, indicative of household ambulator General Gait Details: Hands on min guard for slow, shuffling gait, mild instability but no overt LoB         Balance Overall balance assessment: Needs assistance Sitting-balance support: No upper extremity supported;Feet supported Sitting balance-Leahy Scale: Fair     Standing balance support: Bilateral upper extremity supported Standing balance-Leahy Scale: Fair Standing balance comment: able to statically stand without assist                             Cognition Arousal/Alertness: Awake/alert Behavior During Therapy: WFL for tasks assessed/performed Overall Cognitive Status: Impaired/Different from baseline Area of Impairment: Problem solving;Following commands                       Following Commands: Follows one step commands with increased time;Follows multi-step commands with increased time;Follows multi-step commands consistently     Problem Solving: Slow processing;Requires verbal cues;Requires tactile cues General Comments: continues to be slow in response to questions and commands         General Comments General  comments (skin integrity, edema, etc.): VSS      Pertinent Vitals/Pain Pain Assessment: Faces Faces Pain Scale: Hurts a little bit Pain Location: low  back  Pain Descriptors / Indicators: Aching;Sore Pain Intervention(s): Limited activity within patient's tolerance;Monitored during session;Repositioned            PT Goals (current goals can now be found in the care plan section) Acute Rehab PT Goals Patient Stated Goal: stop falling PT Goal Formulation: With patient Time For Goal Achievement: 08/22/19 Potential to Achieve Goals: Fair Progress towards PT goals: Progressing toward goals    Frequency    Min 3X/week      PT Plan Current plan remains appropriate       AM-PAC PT "6 Clicks" Mobility   Outcome Measure  Help needed turning from your back to your side while in a flat bed without using bedrails?: None Help needed moving from lying on your back to sitting on the side of a flat bed without using bedrails?: None Help needed moving to and from a bed to a chair (including a wheelchair)?: None Help needed standing up from a chair using your arms (e.g., wheelchair or bedside chair)?: None Help needed to walk in hospital room?: A Little Help needed climbing 3-5 steps with a railing? : A Lot 6 Click Score: 21    End of Session Equipment Utilized During Treatment: Gait belt Activity Tolerance: Patient tolerated treatment well Patient left: in chair;with call bell/phone within reach;with chair alarm set Nurse Communication: Mobility status PT Visit Diagnosis: Unsteadiness on feet (R26.81);Other abnormalities of gait and mobility (R26.89);Muscle weakness (generalized) (M62.81);History of falling (Z91.81);Repeated falls (R29.6);Difficulty in walking, not elsewhere classified (R26.2);Dizziness and giddiness (R42)     Time: 1436-1450 PT Time Calculation (min) (ACUTE ONLY): 14 min  Charges:  $Gait Training: 8-22 mins                     Jonathandavid Marlett B. Migdalia Dk PT, DPT Acute Rehabilitation Services Pager [phone] Office [phone]    [place] 08/09/2019, 2:56 PM

## 2019-08-09 NOTE — Care Management Important Message (Signed)
Important Message  Patient Details  Name: Sheri Booth MRN: [id number] Date of Birth: 01/05/1946   Medicare Important Message Given:  Yes     [name] 08/09/2019, 11:20 AM

## 2019-08-09 NOTE — Care Management (Addendum)
Spoke w patient in room. Patient was difficult to ascertain information from. She was oriented to situation, but stated it was January, she could answer what hospital she was admitted to, that she was married, and who the president was, but these answers did not come easily to her. She states that she is from home with her spouse. Outpatient records support this. She stated that her husband may be staying with their niece. I tried to contact spouse at all numbers listed below but there was either no answer, no voicemail set up, or the number had been disconnected.  [address], [place] [address] [phone] (Home) [phone] [facility]) [phone] (Home) English (Preferred) White / Not Hispanic or Latino Married  Patient Contacts  Contact Name Contact Address Communication [place] [address] [phone] (Home) Spouse, Emergency Contact   Outpatient records did not mention that patient was set up with home health services, but patient states that spouse has a nurse coming for his "sugar diabetes." She would like the same home health company.   Update- Spoke with nurse at her PCP's office who looked at her husband's medical records and saw that he had a referral to [facility] (phone [phone], fax [phone]) in March.   Spoke w Nira Conn at [facility] who verified that spouse is active. She is able to accept referral. Please fax demographics, H&P, orders, and DC note fax number above at DC.

## 2019-08-09 NOTE — Progress Notes (Signed)
PROGRESS NOTE    [name]  HDQ:[id number] DOB: 07-12-1946 DOA: 08/06/2019 PCP: Eleanora Neighbor, MD  Outpatient Specialists:   Brief Narrative:  Sheri Booth is a 73 y.o. female with medical history significant of CAD status post stent x2, GERD, incontinence and depression who presents as a transfer [facility] for higher acuity of care secondary to new onset atrial fibrillation with RVR.  Per report, she was found to have atrial fibrillation with RVR with rates up to 116 with nonspecific T wave changes in V5-V6 on EKG.  Troponin of 0.73 then 0.87.  She was then started on IV heparin and IV diltiazem drip with eventual conversion back to normal sinus.  Patient had a CT chest that also noted basilar infiltrate concerning for pneumonia.  Also noted cardiomegaly with minimal pericardial fluid.  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  Assessment & Plan:   Active Problems:   Atrial fibrillation with RVR (HCC)   Thrombocytopenia (HCC)   Community acquired bacterial pneumonia  New onset atrial fibrillation with RVR/new diagnosis of systolic congestive heart failure -Patient was in sinus rhythm after arrival from outside facility.suspect secondary to hypovolemia from vomiting. -CHADVas of 4-  TSH normal -Echo shows 40 to 45% ejection fraction but no wall motion abnormality.  Patient has remained in normal sinus rhythm and her IV Cardizem was switched to oral Cardizem and heparin has been switched to Eliquis.  Carvedilol was later on added.  Continue current dose of carvedilol, diltiazem CD and Eliquis.  08/08/2019: My colleague hospitalist discussed case with cardiology team, but no formal cardiology consultation.  They recommended outpatient follow-up with cardiology.   Possible community Acquired pneumonia with possible  lung mass -Patient found to have bibasilar infiltrate on CT chest from outside facility.  Not sure whether the CT was with or without contrast.  No report available to me for review.  Chest x-ray here shows that mass cannot be ruled out.  I had ordered CT of the chest with contrast before 9 AM as stat so we can get that done unlikely discharge patient however for some unknown reasons, this was not done as a state and currently at 5 PM, patient is still down to have her CT and for this reason, she cannot be discharged today.  Thrombocytopenia -Stable.  No signs of bleeding.  Continue to monitor.  Recurrent falls (as per patient): -Seen by PT OT.  Recommended home health.  Possible cognitive deficit: -Patient remains a very poor historian. -Collateral information will be needed.  Hypertensive urgency: Blood pressure controlled.  Continue Coreg and lisinopril and Cardizem.   Further management depend on hospital course.   DVT prophylaxis: IV heparin Code Status: Full Family Communication:  Disposition Plan: Home Consults called:  Disposition Plan: This will depend on hospital course.  Await physical therapy input.  Procedures:   Echo done 08/07/2019.  The result is pending.  Antimicrobials:   IV azithromycin.  IV ceftriaxone   Subjective: Seen and examined.  No complaints.  Denied any shortness of breath.  Objective: Vitals:   08/09/19 0451 08/09/19 1031 08/09/19 1219 08/09/19 1300  BP: (!) 143/88 (!) 145/58 (!) 145/98   Pulse: 68 70 65 67  Resp: 17   20  Temp: 97.8 F (36.6 C)   (!) 97.5 F (36.4 C)  TempSrc: Oral   Oral  SpO2: 98%  99%  Weight:      Height:        Intake/Output Summary (Last 24 hours) at 08/09/2019 1649 Last data filed at 08/09/2019 0735 Gross per 24 hour  Intake 360 ml  Output 150 ml  Net 210 ml   Filed Weights   08/07/19 0542 08/08/19 0600 08/09/19 0102  Weight: 73.6 kg 72.8 kg 73.8 kg    Examination:  General exam: Appears calm  and comfortable  Respiratory system: Clear to auscultation. Respiratory effort normal. Cardiovascular system: S1 & S2 heard, RRR. No JVD, murmurs, rubs, gallops or clicks. No pedal edema. Gastrointestinal system: Abdomen is nondistended, soft and nontender. No organomegaly or masses felt. Normal bowel sounds heard. Central nervous system: Alert and oriented. No focal neurological deficits. Extremities: Symmetric 5 x 5 power. Skin: No rashes, lesions or ulcers.  Psychiatry: Judgement and insight appear poor. Mood & affect flat.  Data Reviewed: I have personally reviewed following labs and imaging studies  CBC: Recent Labs  Lab 08/06/19 2123 08/07/19 0002 08/08/19 0635 08/09/19 0546  WBC 5.8 7.0 5.1 5.0  HGB 11.2* 12.2 12.1 11.5*  HCT 34.9* 36.7 36.9 33.9*  MCV 93.6 91.5 92.5 90.4  PLT 119* 126* 123* 742*   Basic Metabolic Panel: Recent Labs  Lab 08/06/19 2123 08/09/19 0546  NA 142 136  K 3.9 3.7  CL 111 105  CO2 24 21*  GLUCOSE 91 111*  BUN 13 17  CREATININE 0.72 0.72  CALCIUM 9.0 9.3  MG  --  1.7  PHOS  --  4.2   GFR: Estimated Creatinine Clearance: 58 mL/min (by C-G formula based on SCr of 0.72 mg/dL). Liver Function Tests: Recent Labs  Lab 08/09/19 0546  ALBUMIN 3.3*   No results for input(s): LIPASE, AMYLASE in the last 168 hours. No results for input(s): AMMONIA in the last 168 hours. Coagulation Profile: No results for input(s): INR, PROTIME in the last 168 hours. Cardiac Enzymes: No results for input(s): CKTOTAL, CKMB, CKMBINDEX, TROPONINI in the last 168 hours. BNP (last 3 results) No results for input(s): PROBNP in the last 8760 hours. HbA1C: No results for input(s): HGBA1C in the last 72 hours. CBG: No results for input(s): GLUCAP in the last 168 hours. Lipid Profile: No results for input(s): CHOL, HDL, LDLCALC, TRIG, CHOLHDL, LDLDIRECT in the last 72 hours. Thyroid Function Tests: Recent Labs    08/06/19 2123  TSH 1.948   Anemia Panel: No  results for input(s): VITAMINB12, FOLATE, FERRITIN, TIBC, IRON, RETICCTPCT in the last 72 hours. Urine analysis: No results found for: COLORURINE, APPEARANCEUR, LABSPEC, PHURINE, GLUCOSEU, HGBUR, BILIRUBINUR, KETONESUR, PROTEINUR, UROBILINOGEN, NITRITE, LEUKOCYTESUR Sepsis Labs: @LABRCNTIP (procalcitonin:4,lacticidven:4)  )No results found for this or any previous visit (from the past 240 hour(s)).       Radiology Studies: Dg Chest Port 1 View  Result Date: 08/09/2019 CLINICAL DATA:  Shortness of breath.  CHF. EXAM: PORTABLE CHEST 1 VIEW COMPARISON:  08/07/2019. FINDINGS: Cardiomegaly with pulmonary venous congestion and bilateral interstitial prominence again noted suggesting CHF. Pneumonitis cannot be excluded. Left lung base density noted. This could represent atelectasis, infiltrate, and/or asymmetric pulmonary edema. Underlying pulmonary mass cannot be excluded and follow-up exam suggested to demonstrate resolution. No prominent pleural effusion. No pneumothorax. IMPRESSION: 1. Cardiomegaly with bilateral interstitial prominence again noted suggesting CHF. Pneumonitis cannot be excluded. 2. Left lung base density noted. This could represent atelectasis, infiltrate, and/or asymmetric pulmonary edema. Underlying pulmonary mass cannot be excluded and follow-up exam to demonstrate resolution suggested. Electronically Signed  By: Inez   On: 08/09/2019 07:29        Scheduled Meds: . apixaban  5 mg Oral BID  . azithromycin  500 mg Oral Daily  . carvedilol  3.125 mg Oral BID WC  . diltiazem  120 mg Oral Daily  . DULoxetine  60 mg Oral Daily  . famotidine  20 mg Oral BID  . fluticasone  2 spray Each Nare Daily  . lisinopril  40 mg Oral Daily  . loratadine  10 mg Oral Daily  . oxybutynin  5 mg Oral Daily  . pantoprazole  40 mg Oral Daily  . rosuvastatin  20 mg Oral Daily   Continuous Infusions: . cefTRIAXone (ROCEPHIN)  IV 1 g (08/08/19 2131)     LOS: 3 days    Time  spent: 40 minutes.  Ishmael Holter, MD  [facility] Pager #: [phone] 7PM-7AM contact night coverage as above

## 2019-08-10 ENCOUNTER — Encounter (HOSPITAL_COMMUNITY): Payer: Self-pay | Admitting: Acute Care

## 2019-08-10 ENCOUNTER — Inpatient Hospital Stay: Payer: Self-pay

## 2019-08-10 DIAGNOSIS — R918 Other nonspecific abnormal finding of lung field: Secondary | ICD-10-CM

## 2019-08-10 DIAGNOSIS — Z7901 Long term (current) use of anticoagulants: Secondary | ICD-10-CM

## 2019-08-10 LAB — BASIC METABOLIC PANEL
Anion gap: 9 (ref 5–15)
BUN: 18 mg/dL (ref 8–23)
CO2: 24 mmol/L (ref 22–32)
Calcium: 9.6 mg/dL (ref 8.9–10.3)
Chloride: 106 mmol/L (ref 98–111)
Creatinine, Ser: 0.63 mg/dL (ref 0.44–1.00)
GFR calc Af Amer: >60 mL/min (ref >60)
GFR calc non Af Amer: >60 mL/min (ref >60)
Glucose, Bld: 109 mg/dL — ABNORMAL HIGH (ref 70–99)
Potassium: 3.8 mmol/L (ref 3.5–5.1)
Sodium: 139 mmol/L (ref 135–145)

## 2019-08-10 LAB — CBC
HCT: 34 % — ABNORMAL LOW (ref 36.0–46.0)
Hemoglobin: 11.2 g/dL — ABNORMAL LOW (ref 12.0–15.0)
MCH: 29.9 pg (ref 26.0–34.0)
MCHC: 32.9 g/dL (ref 30.0–36.0)
MCV: 90.7 fL (ref 80.0–100.0)
Platelets: 131 10*3/uL — ABNORMAL LOW (ref 150–400)
RBC: 3.75 MIL/uL — ABNORMAL LOW (ref 3.87–5.11)
RDW: 13.5 % (ref 11.5–15.5)
WBC: 6 10*3/uL (ref 4.0–10.5)
nRBC: 0 % (ref 0.0–0.2)

## 2019-08-10 LAB — RESPIRATORY PANEL BY PCR

## 2019-08-10 LAB — PROCALCITONIN: Procalcitonin: <0.1 ng/mL

## 2019-08-10 LAB — SARS CORONAVIRUS 2 BY RT PCR (HOSPITAL ORDER, PERFORMED IN ~~LOC~~ HOSPITAL LAB): SARS Coronavirus 2: NEGATIVE

## 2019-08-10 MED ORDER — LISINOPRIL 40 MG PO TABS: 40.0000 mg | ORAL_TABLET | Freq: Every day | ORAL | 0 refills | Status: AC

## 2019-08-10 MED ORDER — CARVEDILOL 3.125 MG PO TABS: 3.1250 mg | ORAL_TABLET | Freq: Two times a day (BID) | ORAL | 0 refills | Status: AC

## 2019-08-10 MED ORDER — DILTIAZEM HCL ER COATED BEADS 120 MG PO CP24: 120.0000 mg | ORAL_CAPSULE | Freq: Every day | ORAL | 0 refills | Status: AC

## 2019-08-10 MED ORDER — APIXABAN 5 MG PO TABS: 5.0000 mg | ORAL_TABLET | Freq: Two times a day (BID) | ORAL | 0 refills | Status: AC

## 2019-08-10 MED FILL — LISINOPRIL 40 MG TABS: 40 | 30 days supply | Qty: 30 | Fill #0

## 2019-08-10 MED FILL — ELIQUIS 5 MG TABLET: 5 | 30 days supply | Qty: 60 | Fill #0

## 2019-08-10 MED FILL — CARVEDILOL 3.125 MG TABLET: 3.125 | 30 days supply | Qty: 60 | Fill #0

## 2019-08-10 MED FILL — CARTIA XT 120 MG CP24: 120 | 30 days supply | Qty: 30 | Fill #0

## 2019-08-10 NOTE — Discharge Summary (Signed)
Physician Discharge Summary  Sheri Booth WRU:[id number] DOB: 09-11-1946 DOA: 08/06/2019  PCP: Eleanora Neighbor, MD  Admit date: 08/06/2019 Discharge date: 08/10/2019  Admitted From: Home Disposition: Home  Recommendations for Outpatient Follow-up:  1. Follow up with PCP in 1-2 weeks 2. Follow with pulmonology and oncology for further evaluation of lung mass (we have already set up an appointment for her to see Dr. Nyoka Cowden on 08/18/2019 at 11 AM) 3. Please obtain BMP/CBC in one week 4. Please follow up on the following pending results:  Home Health: Yes Equipment/Devices: Rolling walker  Discharge Condition: Stable CODE STATUS: DNR Diet recommendation: Cardiac  Subjective: Patient seen and examined twice today.  She has no complaints.  She is alert and oriented.  Denied any chest pain or shortness of breath.  Brief/Interim Summary: Sheri Gallimoreis a 73 y.o.femalewith medical history significant ofCAD status post stent x2, GERD, incontinence and depression who [facility] a [facility] higher acuity of care secondary to new onset atrial fibrillation with RVR. Per report, she was found to have atrial fibrillation with RVR with rates up to 116 with nonspecific T wave changes in V5-V6 on EKG. Troponin of 0.73 then0.87.She was then started on IV heparin and IV diltiazem drip with eventual conversion back to normal sinus.  Patient had a CT chest (unknown whether that was with or without contrast ) that also noted basilar infiltrate concerning for pneumonia. Also noted cardiomegaly with minimal pericardial fluid. 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  When she was admitted here, her TSH was normal.  Transthoracic echo was obtained which showed 40 to 45% ejection fraction.  She also had left ventricular  diffuse hypokinesis.  My colleague Dr. Marthenia Rolling discussed the case with on-call cardiology on 08/08/2019 and they had recommended outpatient follow-up, no formal consultation was done.  Subsequently, she was started on Cardizem XL and then her blood pressure was slightly high so she was started on carvedilol.  Once we found out her ejection fraction was 45% so she was also started on lisinopril for high blood pressure.  She was also switched to Eliquis for anticoagulation.  She was also continued on Rocephin and azithromycin for possible community-acquired pneumonia and she has completed 5 days of antibiotic therapy.  She had thrombocytopenia which remained stable despite being on anticoagulation and she had no signs of bleeding.  I assumed her care on 08/09/2019.  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  I consulted pulmonology on early morning of 08/10/2019.  Patient seems to have significant cognitive deficit and she does not seem to be in a position to understand complex medical problems and the gravity of the situation so we will try to call her husband several times including myself and pulmonologist but we were unable to get a hold of him and ironically, he showed up in the hospital late afternoon.  I myself and Dr. Carlis Abbott from PCCM talk to the patient and her husband at the bedside.  He told us that patient was initially diagnosed with lung mass in 2012.  He then told us that they were told that she never had diagnosed with cancer (and he was also not sure if there was any biopsy done) but he confirmed that  she received some radiation therapy in the past.  He also confirmed that she was last seen by her pulmonologist and consent signed in 2018 and that she was told that she was cancer free.  We told the patient and her husband that now she has twice the bigger size of lung mass which is 5.4 cm and that this  is highly concerning for possible cancer and that this needs to be investigated with lung biopsy.  Patient's husband preferred further work-up to be done at previous physicians in [place].  Dr. Carlis Abbott from PCCM arranged his follow-up appointment with pulmonologist in [place] hometown on 08/18/2019 as well.  She also scheduled her follow-up visit with her primary care on next Wednesday.  Patient is completely stable for discharge.  She is being discharged on carvedilol, Cardizem p.o., lisinopril and Eliquis.  She is also being provided with CD for her CT chest which we can bring to her pulmonologist and PCP. Of note, CT chest also showed bilateral groundglass opacities so we tested her for COVID 19 and she was tested negative as well.  Discharge Diagnoses:  Active Problems:   New onset a-fib ([place])   Thrombocytopenia ([place])   Community acquired bacterial pneumonia   Mass of lower lobe of left lung    Discharge Instructions  Discharge Instructions    Discharge patient   Complete by: As directed    Discharge disposition: 06-Home-Health Care Svc   Discharge patient date: 08/10/2019     Allergies as of 08/10/2019   No Known Allergies     Medication List    TAKE these medications   albuterol 108 (90 Base) MCG/ACT inhaler Commonly known as: VENTOLIN HFA Inhale 2 puffs into the lungs every 6 (six) hours as needed for wheezing or shortness of breath.   apixaban 5 MG Tabs tablet Commonly known as: Eliquis Take 1 tablet (5 mg total) by mouth 2 (two) times daily.   carvedilol 3.125 MG tablet Commonly known as: COREG Take 1 tablet (3.125 mg total) by mouth 2 (two) times daily with a meal.   diltiazem 120 MG 24 hr capsule Commonly known as: CARDIZEM CD Take 1 capsule (120 mg total) by mouth daily. Start taking on: August 11, 2019   DULoxetine 60 MG capsule Commonly known as: CYMBALTA Take 60 mg by mouth daily.   famotidine 20 MG tablet Commonly known as: PEPCID Take 20 mg by  mouth 2 (two) times daily.   hydrOXYzine 10 MG tablet Commonly known as: ATARAX/VISTARIL Take 10 mg by mouth every 8 (eight) hours as needed for anxiety.   lisinopril 40 MG tablet Commonly known as: ZESTRIL Take 1 tablet (40 mg total) by mouth daily. Start taking on: August 11, 2019   loratadine 10 MG tablet Commonly known as: CLARITIN Take 10 mg by mouth daily.   mometasone 50 MCG/ACT nasal spray Commonly known as: NASONEX Place 2 sprays into the nose daily.   nitroGLYCERIN 0.4 MG SL tablet Commonly known as: NITROSTAT Place 0.4 mg under the tongue every 5 (five) minutes as needed for chest pain.   olmesartan 20 MG tablet Commonly known as: BENICAR Take 20 mg by mouth daily.   omeprazole 20 MG capsule Commonly known as: PRILOSEC Take 20 mg by mouth daily.   oxybutynin 5 MG tablet Commonly known as: DITROPAN Take 5 mg by mouth daily.   rosuvastatin 20 MG tablet Commonly known as: CRESTOR Take 20 mg by mouth daily.   traZODone 50 MG tablet Commonly known  as: DESYREL Take 50 mg by mouth at bedtime.            Durable Medical Equipment  (From admission, onward)         Start     Ordered   08/09/19 1313  For home use only DME Walker rolling  Once    Question:  Patient needs a walker to treat with the following condition  Answer:  Balance problem   08/09/19 1313         Follow-up Information    Eleanora Neighbor, MD Follow up in 1 week(s).   Specialty: Internal Medicine Contact information: [facility] [address] [place] [id number] [phone]          No Known Allergies  Consultations: PCCM   Procedures/Studies: Ct Chest W Contrast  Result Date: 08/09/2019 CLINICAL DATA:  73 year old with a mass or consolidation in the LEFT LOWER LOBE on portable chest x-rays earlier today and on 08/07/2019. EXAM: CT CHEST WITH CONTRAST TECHNIQUE: Multidetector CT imaging of the chest was performed during intravenous contrast administration. CONTRAST:   127mL OMNIPAQUE IOHEXOL 300 MG/ML IV. COMPARISON:  No prior chest CT. FINDINGS: Cardiovascular: Heart moderately enlarged. Severe three-vessel coronary atherosclerosis. Dense mitral annular calcification. No pericardial effusion. Moderate to severe atherosclerosis involving the thoracic and proximal abdominal aorta and their visible branches without evidence of aneurysm. Mediastinum/Nodes: Numerous upper normal sized lymph nodes throughout the mediastinum and in both hila, the largest a station 2R node measuring approximately 1.8 x 1.1 cm. No nodal masses. Moderate-sized hiatal hernia. Mild distal esophageal wall thickening. Lungs/Pleura: Mass involving the posterior basal segment of the LEFT LOWER LOBE measuring approximately 5.3 x 2.8 x 3.6 cm (series 4, image 85 and coronal image 74). The mass is associated with architectural distortion and is tethered to the MEDIAL, LATERAL and diaphragmatic pleura, best seen on the coronal images. No parenchymal nodules or masses elsewhere in either lung. Patchy ground-glass airspace opacities throughout both lungs with a focal more confluent airspace consolidation in the RIGHT UPPER LOBE associated with air bronchograms. No pleural effusions. Upper Abdomen: Unremarkable for the early arterial phase of enhancement. Musculoskeletal: Mild osseous demineralization. Regional skeleton otherwise unremarkable. IMPRESSION: 1. Mass involving the posterior basal segment of the LEFT LOWER LOBE to the diaphragmatic pleura, measuring up to 5.3 cm greatest diameter. This mass is associated with architectural distortion and is tethered to the pleura. 2. Patchy ground-glass airspace opacities throughout both lungs with a focal more confluent airspace consolidation in the RIGHT UPPER LOBE with air bronchograms, likely representing multilobar pneumonia. 3. Numerous upper normal-sized lymph nodes throughout the mediastinum and in both hila, likely reactive. 4. Moderate-sized hiatal hernia. Mild  distal esophageal wall thickening, query GE reflux disease. 5. Severe three-vessel coronary atherosclerosis. Aortic Atherosclerosis (ICD10-I70.0). Electronically Signed   By: Evangeline Dakin M.D.   On: 08/09/2019 17:40   Dg Chest Port 1 View  Result Date: 08/09/2019 CLINICAL DATA:  Shortness of breath.  CHF. EXAM: PORTABLE CHEST 1 VIEW COMPARISON:  08/07/2019. FINDINGS: Cardiomegaly with pulmonary venous congestion and bilateral interstitial prominence again noted suggesting CHF. Pneumonitis cannot be excluded. Left lung base density noted. This could represent atelectasis, infiltrate, and/or asymmetric pulmonary edema. Underlying pulmonary mass cannot be excluded and follow-up exam suggested to demonstrate resolution. No prominent pleural effusion. No pneumothorax. IMPRESSION: 1. Cardiomegaly with bilateral interstitial prominence again noted suggesting CHF. Pneumonitis cannot be excluded. 2. Left lung base density noted. This could represent atelectasis, infiltrate, and/or asymmetric pulmonary edema. Underlying pulmonary  mass cannot be excluded and follow-up exam to demonstrate resolution suggested. Electronically Signed   By: Marcello Moores  Register   On: 08/09/2019 07:29   Dg Chest Port 1 View  Result Date: 08/07/2019 CLINICAL DATA:  Pneumonia history of atrial fibrillation EXAM: PORTABLE CHEST 1 VIEW COMPARISON:  None. FINDINGS: Borderline cardiomegaly. Slightly low lung volume. Perihilar interstitial opacity with mild nodularity. Irregular left infrahilar opacity and slight asymmetric density in the left hilus. Aortic atherosclerosis. No pneumothorax. IMPRESSION: 1. Increased perihilar interstitial opacity with slight nodularity, possible atypical infection. 2. Irregular focus of airspace disease in the left infrahilar lung, with slightly masslike features, recommend contrast-enhanced chest CT for further evaluation. Slight asymmetric density in the left hilus could also be evaluated at that time.  Electronically Signed   By: Donavan Foil M.D.   On: 08/07/2019 15:23   Ct Outside Films Body/abd/pelvis  Result Date: 08/10/2019 This examination belongs to an outside facility and is stored here for comparison purposes only.  Contact the originating outside institution for any associated report or interpretation.    Discharge Exam: Vitals:   08/10/19 1025 08/10/19 1336  BP: (!) 150/63 (!) 134/56  Pulse:  65  Resp:    Temp:  97.7 F (36.5 C)  SpO2:  100%   Vitals:   08/10/19 0447 08/10/19 0450 08/10/19 1025 08/10/19 1336  BP:  (!) 151/60 (!) 150/63 (!) 134/56  Pulse:  63  65  Resp:  20    Temp:  97.6 F (36.4 C)  97.7 F (36.5 C)  TempSrc:  Oral  Oral  SpO2:  97%  100%  Weight: 72.5 kg     Height:        General: Pt is alert, awake, not in acute distress Cardiovascular: RRR, S1/S2 +, no rubs, no gallops Respiratory: CTA bilaterally, no wheezing, no rhonchi Abdominal: Soft, NT, ND, bowel sounds + Extremities: no edema, no cyanosis    The results of significant diagnostics from this hospitalization (including imaging, microbiology, ancillary and laboratory) are listed below for reference.     Microbiology: Recent Results (from the past 240 hour(s))  Respiratory Panel by PCR     Status: None   Collection Time: 08/10/19 10:33 AM   Specimen: Nasopharyngeal Swab; Respiratory  Result Value Ref Range Status   Adenovirus NOT DETECTED NOT DETECTED Final   Coronavirus 229E NOT DETECTED NOT DETECTED Final    Comment: (NOTE) The Coronavirus on the Respiratory Panel, DOES NOT test for the novel  Coronavirus (2019 nCoV)    Coronavirus HKU1 NOT DETECTED NOT DETECTED Final   Coronavirus NL63 NOT DETECTED NOT DETECTED Final   Coronavirus OC43 NOT DETECTED NOT DETECTED Final   Metapneumovirus NOT DETECTED NOT DETECTED Final   Rhinovirus / Enterovirus NOT DETECTED NOT DETECTED Final   Influenza A NOT DETECTED NOT DETECTED Final   Influenza B NOT DETECTED NOT DETECTED Final    Parainfluenza Virus 1 NOT DETECTED NOT DETECTED Final   Parainfluenza Virus 2 NOT DETECTED NOT DETECTED Final   Parainfluenza Virus 3 NOT DETECTED NOT DETECTED Final   Parainfluenza Virus 4 NOT DETECTED NOT DETECTED Final   Respiratory Syncytial Virus NOT DETECTED NOT DETECTED Final   Bordetella pertussis NOT DETECTED NOT DETECTED Final   Chlamydophila pneumoniae NOT DETECTED NOT DETECTED Final   Mycoplasma pneumoniae NOT DETECTED NOT DETECTED Final    Comment: Performed at [facility], [address] [place], [place] [address]  SARS Coronavirus 2 [facility] order, Performed in [facility] hospital lab)  Nasopharyngeal Nasopharyngeal Swab     Status: None   Collection Time: 08/10/19 10:33 AM   Specimen: Nasopharyngeal Swab  Result Value Ref Range Status   SARS Coronavirus 2 NEGATIVE NEGATIVE Final    Comment: (NOTE) If result is NEGATIVE SARS-CoV-2 target nucleic acids are NOT DETECTED. The SARS-CoV-2 RNA is generally detectable in upper and lower  respiratory specimens during the acute phase of infection. The lowest  concentration of SARS-CoV-2 viral copies this assay can detect is 250  copies / mL. A negative result does not preclude SARS-CoV-2 infection  and should not be used as the sole basis for treatment or other  patient management decisions.  A negative result may occur with  improper specimen collection / handling, submission of specimen other  than nasopharyngeal swab, presence of viral mutation(s) within the  areas targeted by this assay, and inadequate number of viral copies  (<250 copies / mL). A negative result must be combined with clinical  observations, patient history, and epidemiological information. If result is POSITIVE SARS-CoV-2 target nucleic acids are DETECTED. The SARS-CoV-2 RNA is generally detectable in upper and lower  respiratory specimens dur ing the acute phase of infection.  Positive  results are indicative of active infection with  SARS-CoV-2.  Clinical  correlation with patient history and other diagnostic information is  necessary to determine patient infection status.  Positive results do  not rule out bacterial infection or co-infection with other viruses. If result is PRESUMPTIVE POSTIVE SARS-CoV-2 nucleic acids MAY BE PRESENT.   A presumptive positive result was obtained on the submitted specimen  and confirmed on repeat testing.  While 2019 novel coronavirus  (SARS-CoV-2) nucleic acids may be present in the submitted sample  additional confirmatory testing may be necessary for epidemiological  and / or clinical management purposes  to differentiate between  SARS-CoV-2 and other Sarbecovirus currently known to infect humans.  If clinically indicated additional testing with an alternate test  methodology [phone]) is advised. The SARS-CoV-2 RNA is generally  detectable in upper and lower respiratory sp ecimens during the acute  phase of infection. The expected result is Negative. Fact Sheet for Patients:  StrictlyIdeas.no Fact Sheet for Healthcare Providers: BankingDealers.co.za This test is not yet approved or cleared by the [place] FDA and has been authorized for detection and/or diagnosis of SARS-CoV-2 by FDA under an Emergency Use Authorization (EUA).  This EUA will remain in effect (meaning this test can be used) for the duration of the COVID-19 declaration under Section 564(b)(1) of the Act, 21 U.S.C. section 360bbb-3(b)(1), unless the authorization is terminated or revoked sooner. Performed at [facility], [place] [address] [place], [place] [address]      Labs: BNP (last 3 results) Recent Labs    08/08/19 1109  BNP 811.9*   Basic Metabolic Panel: Recent Labs  Lab 08/06/19 2123 08/09/19 0546 08/10/19 0324  NA 142 136 139  K 3.9 3.7 3.8  CL 111 105 106  CO2 24 21* 24  GLUCOSE 91 111* 109*  BUN 13 17 18   CREATININE 0.72 0.72  0.63  CALCIUM 9.0 9.3 9.6  MG  --  1.7  --   PHOS  --  4.2  --    Liver Function Tests: Recent Labs  Lab 08/09/19 0546  ALBUMIN 3.3*   No results for input(s): LIPASE, AMYLASE in the last 168 hours. No results for input(s): AMMONIA in the last 168 hours. CBC: Recent Labs  Lab 08/06/19 2123 08/07/19 0002 08/08/19  4540 08/09/19 0546 08/10/19 0324  WBC 5.8 7.0 5.1 5.0 6.0  HGB 11.2* 12.2 12.1 11.5* 11.2*  HCT 34.9* 36.7 36.9 33.9* 34.0*  MCV 93.6 91.5 92.5 90.4 90.7  PLT 119* 126* 123* 126* 131*   Cardiac Enzymes: No results for input(s): CKTOTAL, CKMB, CKMBINDEX, TROPONINI in the last 168 hours. BNP: Invalid input(s): POCBNP CBG: No results for input(s): GLUCAP in the last 168 hours. D-Dimer No results for input(s): DDIMER in the last 72 hours. Hgb A1c No results for input(s): HGBA1C in the last 72 hours. Lipid Profile No results for input(s): CHOL, HDL, LDLCALC, TRIG, CHOLHDL, LDLDIRECT in the last 72 hours. Thyroid function studies No results for input(s): TSH, T4TOTAL, T3FREE, THYROIDAB in the last 72 hours.  Invalid input(s): FREET3 Anemia work up No results for input(s): VITAMINB12, FOLATE, FERRITIN, TIBC, IRON, RETICCTPCT in the last 72 hours. Urinalysis No results found for: COLORURINE, APPEARANCEUR, LABSPEC, PHURINE, GLUCOSEU, HGBUR, BILIRUBINUR, KETONESUR, PROTEINUR, UROBILINOGEN, NITRITE, LEUKOCYTESUR Sepsis Labs Invalid input(s): PROCALCITONIN,  WBC,  LACTICIDVEN Microbiology Recent Results (from the past 240 hour(s))  Respiratory Panel by PCR     Status: None   Collection Time: 08/10/19 10:33 AM   Specimen: Nasopharyngeal Swab; Respiratory  Result Value Ref Range Status   Adenovirus NOT DETECTED NOT DETECTED Final   Coronavirus 229E NOT DETECTED NOT DETECTED Final    Comment: (NOTE) The Coronavirus on the Respiratory Panel, DOES NOT test for the novel  Coronavirus (2019 nCoV)    Coronavirus HKU1 NOT DETECTED NOT DETECTED Final   Coronavirus NL63  NOT DETECTED NOT DETECTED Final   Coronavirus OC43 NOT DETECTED NOT DETECTED Final   Metapneumovirus NOT DETECTED NOT DETECTED Final   Rhinovirus / Enterovirus NOT DETECTED NOT DETECTED Final   Influenza A NOT DETECTED NOT DETECTED Final   Influenza B NOT DETECTED NOT DETECTED Final   Parainfluenza Virus 1 NOT DETECTED NOT DETECTED Final   Parainfluenza Virus 2 NOT DETECTED NOT DETECTED Final   Parainfluenza Virus 3 NOT DETECTED NOT DETECTED Final   Parainfluenza Virus 4 NOT DETECTED NOT DETECTED Final   Respiratory Syncytial Virus NOT DETECTED NOT DETECTED Final   Bordetella pertussis NOT DETECTED NOT DETECTED Final   Chlamydophila pneumoniae NOT DETECTED NOT DETECTED Final   Mycoplasma pneumoniae NOT DETECTED NOT DETECTED Final    Comment: Performed at [facility], [address] [place], [place] [address]  SARS Coronavirus 2 [facility] order, Performed in [facility] hospital lab) Nasopharyngeal Nasopharyngeal Swab     Status: None   Collection Time: 08/10/19 10:33 AM   Specimen: Nasopharyngeal Swab  Result Value Ref Range Status   SARS Coronavirus 2 NEGATIVE NEGATIVE Final    Comment: (NOTE) If result is NEGATIVE SARS-CoV-2 target nucleic acids are NOT DETECTED. The SARS-CoV-2 RNA is generally detectable in upper and lower  respiratory specimens during the acute phase of infection. The lowest  concentration of SARS-CoV-2 viral copies this assay can detect is 250  copies / mL. A negative result does not preclude SARS-CoV-2 infection  and should not be used as the sole basis for treatment or other  patient management decisions.  A negative result may occur with  improper specimen collection / handling, submission of specimen other  than nasopharyngeal swab, presence of viral mutation(s) within the  areas targeted by this assay, and inadequate number of viral copies  (<250 copies / mL). A negative result must be combined with clinical  observations, patient history, and  epidemiological information. If result is  POSITIVE SARS-CoV-2 target nucleic acids are DETECTED. The SARS-CoV-2 RNA is generally detectable in upper and lower  respiratory specimens dur ing the acute phase of infection.  Positive  results are indicative of active infection with SARS-CoV-2.  Clinical  correlation with patient history and other diagnostic information is  necessary to determine patient infection status.  Positive results do  not rule out bacterial infection or co-infection with other viruses. If result is PRESUMPTIVE POSTIVE SARS-CoV-2 nucleic acids MAY BE PRESENT.   A presumptive positive result was obtained on the submitted specimen  and confirmed on repeat testing.  While 2019 novel coronavirus  (SARS-CoV-2) nucleic acids may be present in the submitted sample  additional confirmatory testing may be necessary for epidemiological  and / or clinical management purposes  to differentiate between  SARS-CoV-2 and other Sarbecovirus currently known to infect humans.  If clinically indicated additional testing with an alternate test  methodology [phone]) is advised. The SARS-CoV-2 RNA is generally  detectable in upper and lower respiratory sp ecimens during the acute  phase of infection. The expected result is Negative. Fact Sheet for Patients:  StrictlyIdeas.no Fact Sheet for Healthcare Providers: BankingDealers.co.za This test is not yet approved or cleared by the [place] FDA and has been authorized for detection and/or diagnosis of SARS-CoV-2 by FDA under an Emergency Use Authorization (EUA).  This EUA will remain in effect (meaning this test can be used) for the duration of the COVID-19 declaration under Section 564(b)(1) of the Act, 21 U.S.C. section 360bbb-3(b)(1), unless the authorization is terminated or revoked sooner. Performed at [facility], [place] [address] [place], [place] [address]      Time  coordinating discharge: Over 30 minutes  SIGNED:   Darliss Cheney, MD  [facility] 08/10/2019, 4:19 PM Pager [phone]  If 7PM-7AM, please contact night-coverage [email or web address] Password [email or web address]

## 2019-08-10 NOTE — Consult Note (Signed)
NAME:  Sheri Booth, MRN:  [id number], DOB:  [birth year], LOS: 4 ADMISSION DATE:  08/06/2019, CONSULTATION DATE:  08/10/2019 REFERRING MD:  [facility], Dr. Einar Grad, CHIEF COMPLAINT:  Lung mass    Brief History   73 yo F admitted from OSH 9/12 for treatment for A-fib RVR requiring IV antiarrythmic and IV heparin with conversion back to NSR. Imaging at OSH including CT chest and ABD found left lower lobe mass. CT chest completed this visit revealed 5.3cm left lower mass with patchy ground glass opacities throughout both lungs suggestive of multilobar pneumonia. PCCM consulted 9/16.  History of present illness   Sheri Booth is a 73 yo F who presented from OSH for continued treatment of A-fib RVR and elevated troponin. She was started on IV heparin and IV diltiazem with eventual conversion back to NSR. Workup this admission also revealed new systolic congestive heart failure with an EF of 40-45% with severely dilated left atrium. Imaging at outside hospital with both CT chest and abdomen revealed lung mass but originally images unable to be obtained therefore repeat chest CT completed 9/15 which confirmed 5.3cm left lower lobe mass associated with architectural distortion and is tethered to the pleura. CD and medical records obtained from outside hospital confirmed left lower lung mass seen on CT of abdomen in may of 2018.   She continues to remain a very poor historian. On further review of history patient reports chronic cough, diplopia, recent weight loss (unable to comment on amount of time of weight loss), dizziness, orthopnea, and exertionally dyspnea. She denies smoking history. 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 She also reports a strong cancer history of "wide spread" cancer in her mother and colon cancer in her brother. She states she has had a colonoscopy in the past but can't recall the date or the results.   Past Medical History  A-fib  RVR New systolic CHF, EF 40-98% CAD s/p stent x2 GERD Incontinence  Depression [facility] Events   9/12 Admitted 9/13 Eliquis started  9/16 Last dose of Eliquis   Consults:  PCCM  Procedures:    Significant Diagnostic Tests:  CT chest 9/15 >>  5.3cm left lower lobe mass associated with architectural distortion and is tethered to the pleura, patchy ground glass opacities throughout both lungs suggestive of multilobar pneumonia   Micro Data:  COVID 9/16 >> neg RVP 9/16 >> neg  Antimicrobials:  IV Azithromycin 9/12 >> 9/14 IV Ceftriaxone 9/12 >> PO Azithromycin 9/15 >>  Interim history/subjective:  Sheri Booth, alert and oriented with slight underlying confusion. No significant events noted overnight, she remains in NSR. Continues to report congested cough and exertional dyspnea.   Objective   Blood pressure (!) 150/63, pulse 63, temperature 97.6 F (36.4 C), temperature source Oral, resp. rate 20, height 5\' 2"  (1.575 m), weight 72.5 kg, SpO2 97 %.        Intake/Output Summary (Last 24 hours) at 08/10/2019 1327 Last data filed at 08/10/2019 1030 Gross per 24 hour  Intake 340 ml  Output 75 ml  Net 265 ml   Filed Weights   08/08/19 0600 08/09/19 0102 08/10/19 0447  Weight: 72.8 kg 73.8 kg 72.5 kg    Examination: General: Chronically ill appearing elderly Booth in NAD HEENT: MM pink/moist, PERRL, HOH Neuro: Alert and oriented x2-3 with underlying confusion noted  CV: s1s2 regular rate and rhythm, no m/r/g PULM:  No accessory muscle use seen,  rhonchi to bil bases  GI: soft, bsx4 active, non-tender, non-distended  Extremities: warm/dry, no edema  Skin: no rashes or lesions  Resolved Hospital Problem list     Assessment & Plan:   Left lower lobe lung mass -She reports chronic productive cough, diplopia, recent unintentional weight loss and history of "Booth cancer"  -DDx includes infection vs malignancy  -CD and medical  records obtained from outside hospital confirmed left lower lung mass seen on CT of abdomen in May of 2018. P: Hold Eliquis  Corrdinate with family to determine plan of care given long distance from home and advanced age with multiple comorbidities  Arrange for outpatient pulmonary follow up > [place] vs [place] Given diplopia, dizziness, and slight confusion consider obtaining MRI brain  Will need GI follow up for colorectal screening upon discharge   Multilobar infiltrates  -Seen on CT imagine, remains afebrile with congested productive cough  -Assessed by SLP with no signs of aspiration  P: Continue antibiotic therapy, D5/7 Encourage frequent use of IS and flutter valve Continue Mechanical soft diet due to poor dentition   Best practice:  Diet: Mechanical soft diet, thin liq DVT prophylaxis: PO Eliquis  GI prophylaxis: Pepcid Code Status: DNR Family Communication: Will update family 9/16 Disposition: Per primary team  Labs   CBC: Recent Labs  Lab 08/06/19 2123 08/07/19 0002 08/08/19 0635 08/09/19 0546 08/10/19 0324  WBC 5.8 7.0 5.1 5.0 6.0  HGB 11.2* 12.2 12.1 11.5* 11.2*  HCT 34.9* 36.7 36.9 33.9* 34.0*  MCV 93.6 91.5 92.5 90.4 90.7  PLT 119* 126* 123* 126* 131*    Basic Metabolic Panel: Recent Labs  Lab 08/06/19 2123 08/09/19 0546 08/10/19 0324  NA 142 136 139  K 3.9 3.7 3.8  CL 111 105 106  CO2 24 21* 24  GLUCOSE 91 111* 109*  BUN 13 17 18   CREATININE 0.72 0.72 0.63  CALCIUM 9.0 9.3 9.6  MG  --  1.7  --   PHOS  --  4.2  --    GFR: Estimated Creatinine Clearance: 57.6 mL/min (by C-G formula based on SCr of 0.63 mg/dL). Recent Labs  Lab 08/07/19 0002 08/07/19 1121 08/08/19 0635 08/09/19 0546 08/10/19 0324 08/10/19 0832  PROCALCITON  --  0.13  --   --   --  <0.10  WBC 7.0  --  5.1 5.0 6.0  --     Liver Function Tests: Recent Labs  Lab 08/09/19 0546  ALBUMIN 3.3*   No results for input(s): LIPASE, AMYLASE in the last 168 hours. No results  for input(s): AMMONIA in the last 168 hours.  ABG No results found for: PHART, PCO2ART, PO2ART, HCO3, TCO2, ACIDBASEDEF, O2SAT   Coagulation Profile: No results for input(s): INR, PROTIME in the last 168 hours.  Cardiac Enzymes: No results for input(s): CKTOTAL, CKMB, CKMBINDEX, TROPONINI in the last 168 hours.  HbA1C: No results found for: HGBA1C  CBG: No results for input(s): GLUCAP in the last 168 hours.  Review of Systems: positives in bold   Gen: Denies fever, chills, weight change, fatigue, night sweats HEENT: Denies blurred vision, double vision, hearing loss, tinnitus, sinus congestion, rhinorrhea, sore throat, neck stiffness, dysphagia PULM: Denies shortness of breath, cough, sputum production, hemoptysis, wheezing CV: Denies chest pain, edema, orthopnea, paroxysmal nocturnal dyspnea, palpitations GI: Denies abdominal pain, nausea, vomiting, diarrhea, hematochezia, melena, constipation, change in bowel habits GU: Denies dysuria, hematuria, polyuria, oliguria, urethral discharge Endocrine: Denies hot or cold intolerance, polyuria, polyphagia or appetite change Derm: Denies  rash, dry skin, scaling or peeling skin change Heme: Denies easy bruising, bleeding, bleeding gums Neuro: Denies headache, numbness, weakness, slurred speech, dizziness  loss of memory or consciousness  Past Medical History  She,  has a past medical history of A-fib ([place]), CAD S/P percutaneous coronary angioplasty, Cancer ([place]), Depression, GERD (gastroesophageal reflux disease), Incontinence, and Systolic congestive heart failure ([place]).   Surgical History    Past Surgical History:  Procedure Laterality Date  . OTHER SURGICAL HISTORY     unknown gyn cancer surgery     Social History   reports that she has never smoked. She has never used smokeless tobacco.   Family History   Her family history includes Cancer in her mother; Colon cancer in her brother; Diabetes in her mother.   Allergies No  Known Allergies   Home Medications  Prior to Admission medications   Medication Sig Start Date End Date Taking? Authorizing Provider  albuterol (VENTOLIN HFA) 108 (90 Base) MCG/ACT inhaler Inhale 2 puffs into the lungs every 6 (six) hours as needed for wheezing or shortness of breath.   Yes [provider]  DULoxetine (CYMBALTA) 60 MG capsule Take 60 mg by mouth daily.   Yes [provider]  famotidine (PEPCID) 20 MG tablet Take 20 mg by mouth 2 (two) times daily.   Yes [provider]  hydrOXYzine (ATARAX/VISTARIL) 10 MG tablet Take 10 mg by mouth every 8 (eight) hours as needed for anxiety.   Yes [provider]  loratadine (CLARITIN) 10 MG tablet Take 10 mg by mouth daily.   Yes [provider]  mometasone (NASONEX) 50 MCG/ACT nasal spray Place 2 sprays into the nose daily.   Yes [provider]  nitroGLYCERIN (NITROSTAT) 0.4 MG SL tablet Place 0.4 mg under the tongue every 5 (five) minutes as needed for chest pain.   Yes [provider]  olmesartan (BENICAR) 20 MG tablet Take 20 mg by mouth daily.   Yes [provider]  omeprazole (PRILOSEC) 20 MG capsule Take 20 mg by mouth daily.   Yes [provider]  oxybutynin (DITROPAN) 5 MG tablet Take 5 mg by mouth daily.   Yes [provider]  rosuvastatin (CRESTOR) 20 MG tablet Take 20 mg by mouth daily.   Yes [provider]  traZODone (DESYREL) 50 MG tablet Take 50 mg by mouth at bedtime.   Yes [provider]          Johnsie Cancel, NP-C [facility] Pgr: [phone] or if no answer [phone] 08/10/19

## 2019-08-10 NOTE — Plan of Care (Signed)
  Problem: Education: Goal: Knowledge of General Education information will improve Description: Including pain rating scale, medication(s)/side effects and non-pharmacologic comfort measures Outcome: Adequate for Discharge   Problem: Health Behavior/Discharge Planning: Goal: Ability to manage health-related needs will improve Outcome: Adequate for Discharge   Problem: Clinical Measurements: Goal: Ability to maintain clinical measurements within normal limits will improve Outcome: Adequate for Discharge Goal: Will remain free from infection Outcome: Adequate for Discharge Goal: Diagnostic test results will improve Outcome: Adequate for Discharge Goal: Respiratory complications will improve Outcome: Adequate for Discharge Goal: Cardiovascular complication will be avoided Outcome: Adequate for Discharge   Problem: Activity: Goal: Risk for activity intolerance will decrease Outcome: Completed/Met   Problem: Nutrition: Goal: Adequate nutrition will be maintained Outcome: Completed/Met   Problem: Pain Managment: Goal: General experience of comfort will improve Outcome: Completed/Met   Problem: Safety: Goal: Ability to remain free from injury will improve Outcome: Completed/Met   Problem: Skin Integrity: Goal: Risk for impaired skin integrity will decrease Outcome: Completed/Met

## 2019-08-10 NOTE — Plan of Care (Addendum)
Discharge instructions & education given to pt & husband Artie at bedside. Not sure how much of the education they both understood however the husband was able to verbalize understanding. Education given on f/u appointments, plan of care, self care & medications. Pt sent home with CD of radiology reports, medications from pharmacy & a walker. All belongings were gathered & sent with pt. All questions were answered. Hoover Brunette, RN   Pt's niece & HCPOA elizabeth called by RN & educated on pt's as well. All questions including her f/u appmt with Dr. Nyoka Cowden & Dr. Jacolyn Reedy office were given to the niece. Hoover Brunette, RN

## 2019-08-10 NOTE — Plan of Care (Signed)
I spoke to Ms. Soldo's husband at bedside. He indicated that she has previously been evaluated for her lung mass (says no diagnosis was reached) at [facility] (Dr. Theone Stanley, who no longer works there, last seen in 2017) and [facility] in [place], [place]. She underwent radiation in the past, but has not been recently. Her husband is interested in her going home today and following up with her doctors in [place].  Appt made with [facility] (Dr. Nyoka Cowden) on 9/24 @ 11AM. Appt made with [facility] (Dr. Jacolyn Reedy office) with Delsa Grana, NP on 9/23 @ 10:30AM.  Discharge summary and copy of pulmonary consult note to be faxed to both practices. Before discharge she will be provided a disk of her chest CT to take to her Pulmonary visit next week.  Julian Hy, DO 08/10/19 4:29 PM [facility]

## 2020-06-24 IMAGING — CT CT CHEST W/ CM
2 of 3 series · 14 of 36 positions shown, 17 images · IV contrast (Omni 300)
Comparison: No prior chest CT.

CLINICAL DATA: 73-year-old with a mass or consolidation in the LEFT
LOWER LOBE on portable chest x-rays earlier today and on 08/07/2019.

EXAM:
CT CHEST WITH CONTRAST
TECHNIQUE: Multidetector CT imaging of the chest was performed during
intravenous contrast administration.
CONTRAST:  100mL OMNIPAQUE IOHEXOL 300 MG/ML IV.

[Series 3: chest with 2mm st · axial · 0.88mm/px · z∈[+1046,+1288]mm · 11 of 143 slices shown, 14 images]
[im 11/143  mediastinal]
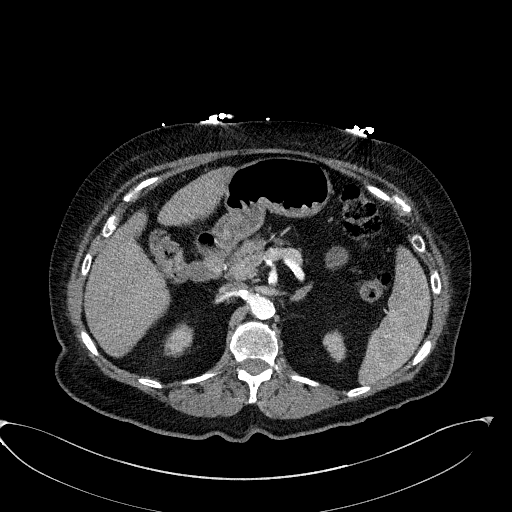
[im 11/143  lung]
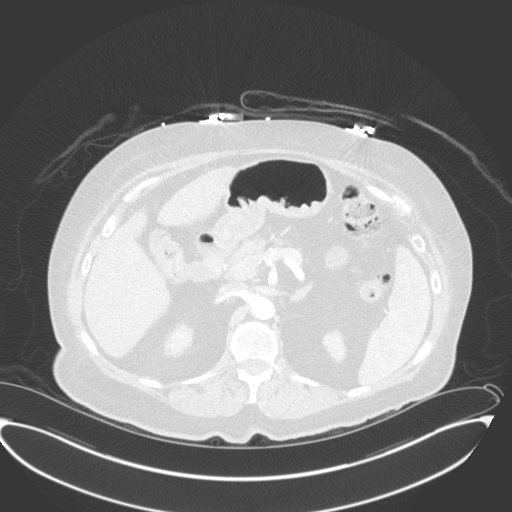
[im 22/143  lung]
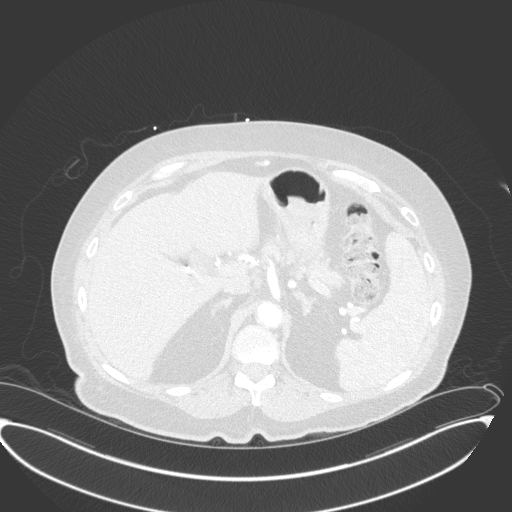
[im 32/143  lung]
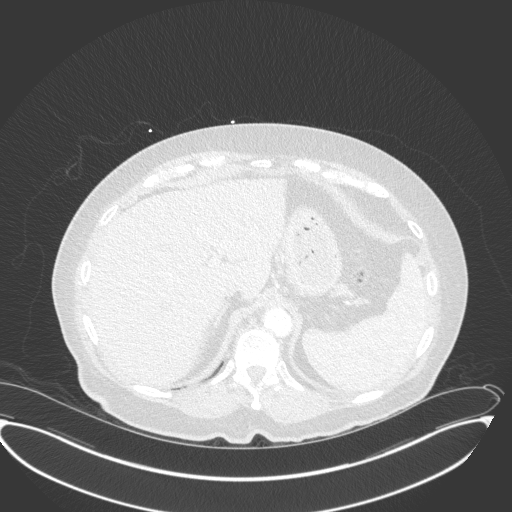
[im 48/143  lung]
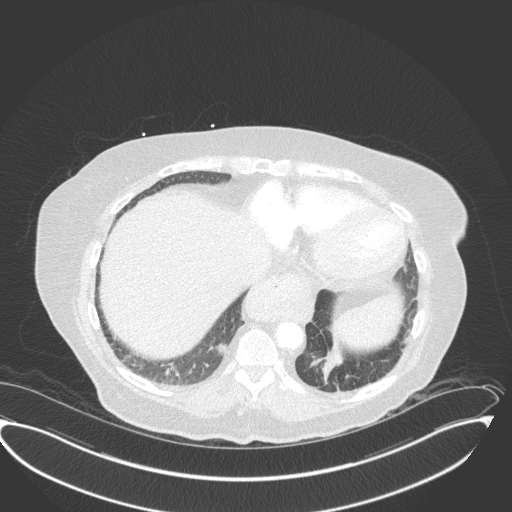
[im 58/143  mediastinal]
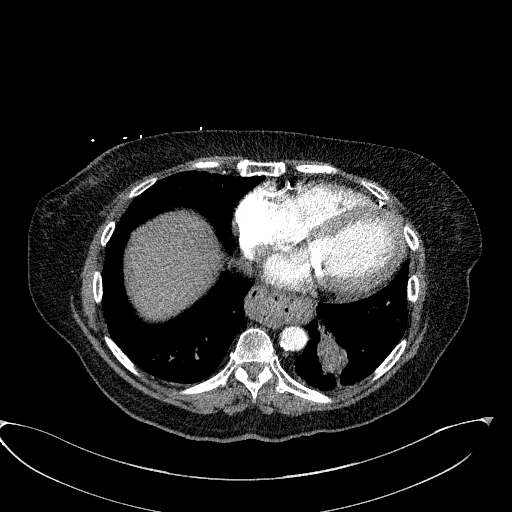
[im 58/143  lung]
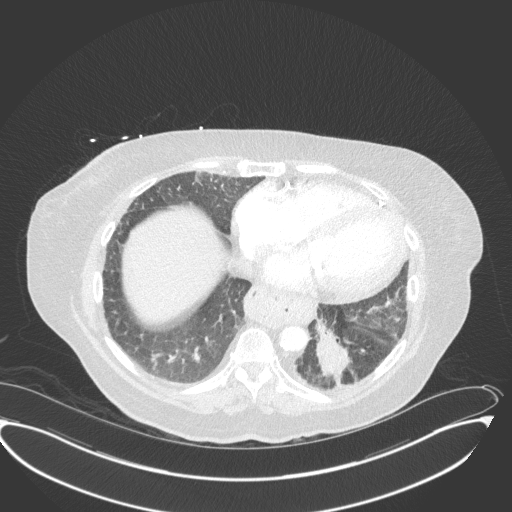
[im 74/143  lung]
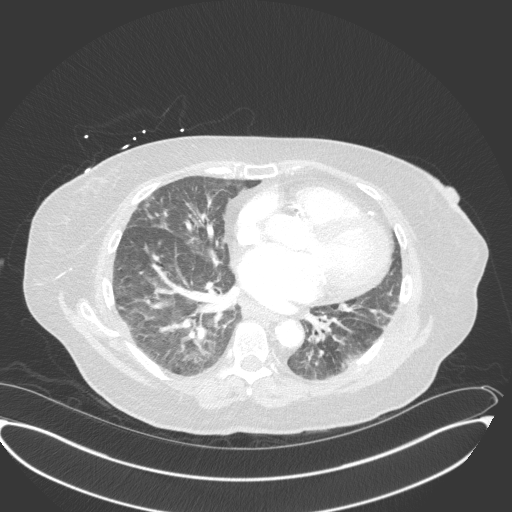
[im 85/143  lung]
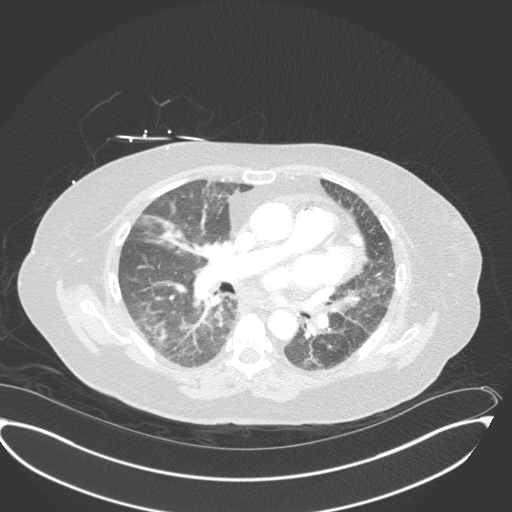
[im 95/143  lung]
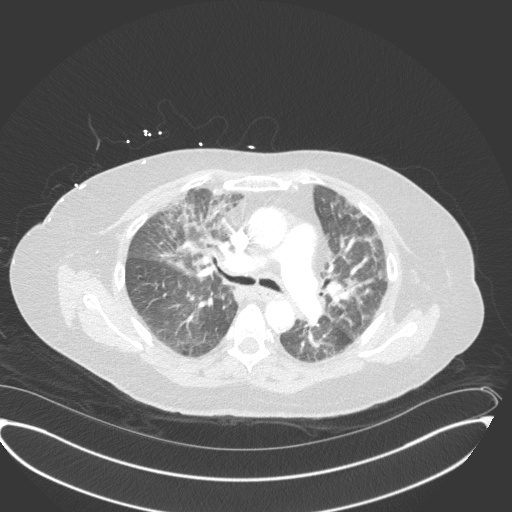
[im 111/143  mediastinal]
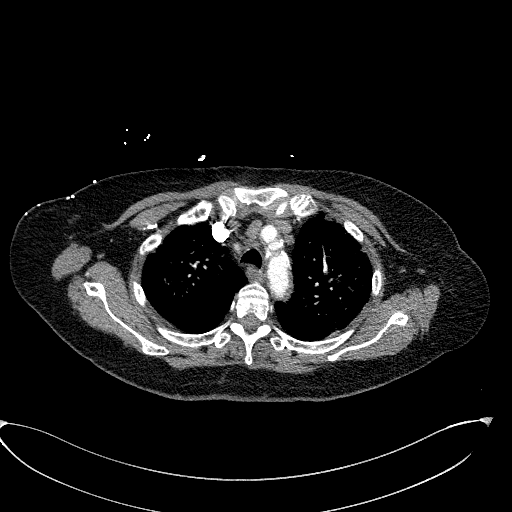
[im 111/143  lung]
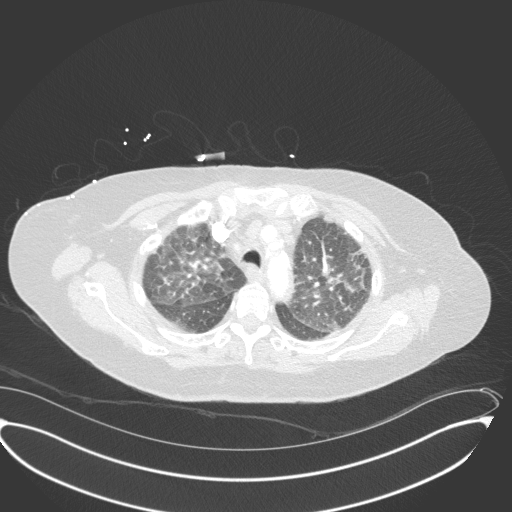
[im 121/143  lung]
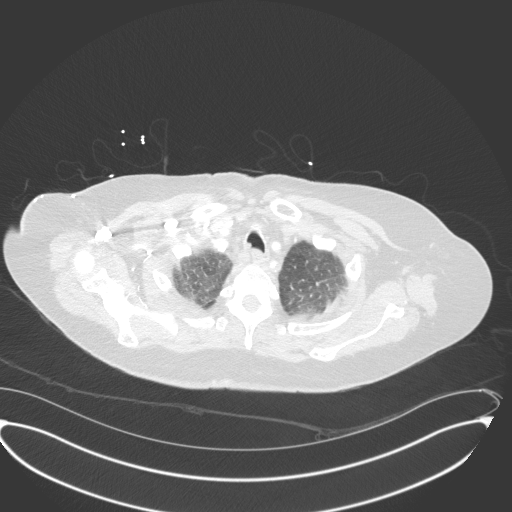
[im 132/143  lung]
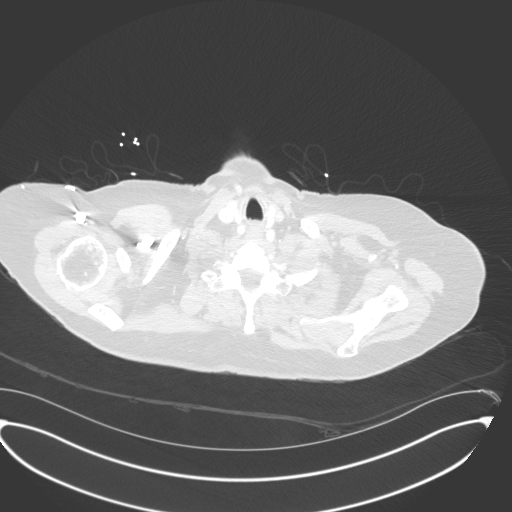

[Series 5: chest with 3mm st cor · coronal · 0.59mm/px · 3 of 103 slices shown]
[im 21/103  lung]
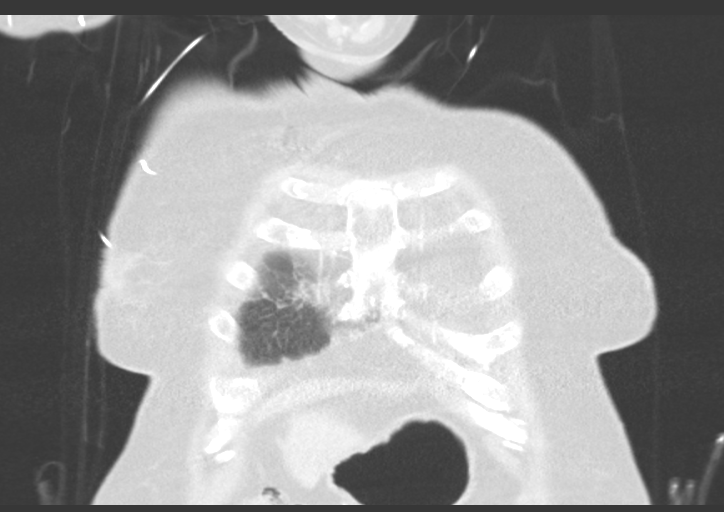
[im 41/103  lung]
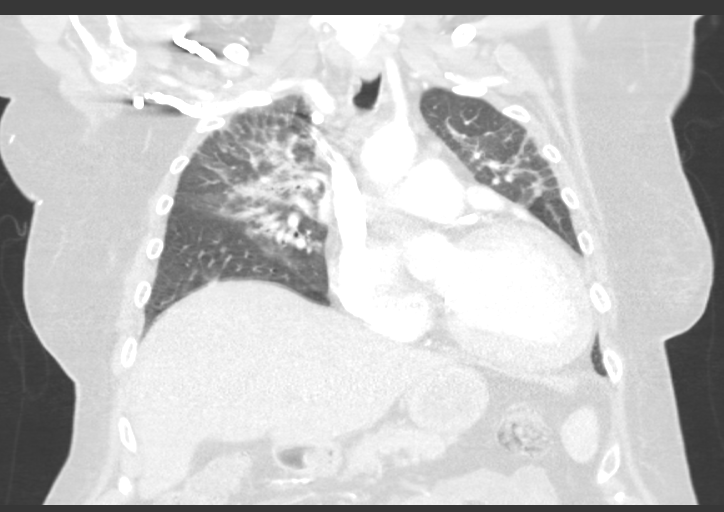
[im 62/103  lung]
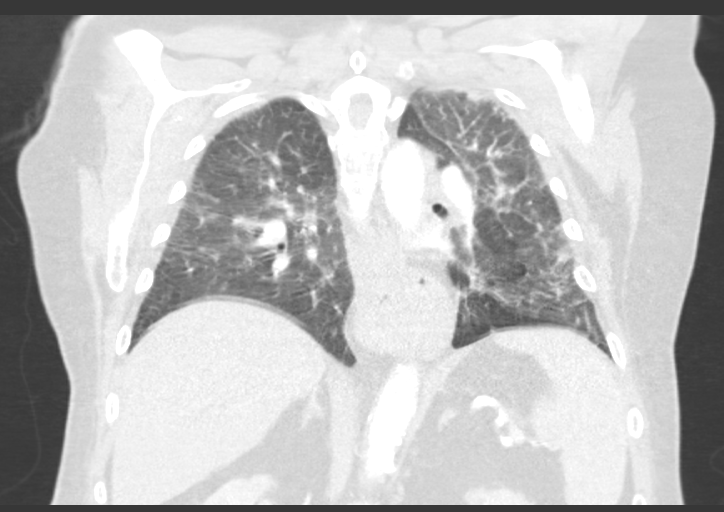

[14 of 36 positions shown; findings below may reference images not displayed]

FINDINGS: Cardiovascular: Heart moderately enlarged. Severe three-vessel
coronary atherosclerosis. Dense mitral annular calcification. No
pericardial effusion. Moderate to severe atherosclerosis involving
the thoracic and proximal abdominal aorta and their visible branches
without evidence of aneurysm.

Mediastinum/Nodes: Numerous upper normal sized lymph nodes
throughout the mediastinum and in both hila, the largest a station
2R node measuring approximately 1.8 x 1.1 cm. No nodal masses.

Moderate-sized hiatal hernia. Mild distal esophageal wall
thickening.

Lungs/Pleura: Mass involving the posterior basal segment of the LEFT
LOWER LOBE measuring approximately 5.3 x 2.8 x 3.6 cm (series 4,
image 85 and coronal image 74). The mass is associated with
architectural distortion and is tethered to the MEDIAL, LATERAL and
diaphragmatic pleura, best seen on the coronal images. No
parenchymal nodules or masses elsewhere in either lung.

Patchy ground-glass airspace opacities throughout both lungs with a
focal more confluent airspace consolidation in the RIGHT UPPER LOBE
associated with air bronchograms. No pleural effusions.

Upper Abdomen: Unremarkable for the early arterial phase of
enhancement.

Musculoskeletal: Mild osseous demineralization. Regional skeleton
otherwise unremarkable.
IMPRESSION: 1. Mass involving the posterior basal segment of the LEFT LOWER LOBE
to the diaphragmatic pleura, measuring up to 5.3 cm greatest
diameter. This mass is associated with architectural distortion and
is tethered to the pleura.
2. Patchy ground-glass airspace opacities throughout both lungs with
a focal more confluent airspace consolidation in the RIGHT UPPER
LOBE with air bronchograms, likely representing multilobar
pneumonia.
3. Numerous upper normal-sized lymph nodes throughout the
mediastinum and in both hila, likely reactive.
4. Moderate-sized hiatal hernia. Mild distal esophageal wall
thickening, query GE reflux disease.
5. Severe three-vessel coronary atherosclerosis.

Aortic Atherosclerosis ([id number]).

## 2021-03-24 DEATH — deceased
